# Patient Record
Sex: Female | Born: 1985 | Race: Black or African American | Hispanic: No | Smoking: Current every day smoker
Health system: Southern US, Community
[De-identification: ages and names within clinical notes are randomized; demographics above are authoritative.]

## PROBLEM LIST (undated history)

## (undated) DIAGNOSIS — R569 Unspecified convulsions: Secondary | ICD-10-CM

## (undated) DIAGNOSIS — Z803 Family history of malignant neoplasm of breast: Secondary | ICD-10-CM

## (undated) DIAGNOSIS — K219 Gastro-esophageal reflux disease without esophagitis: Secondary | ICD-10-CM

## (undated) DIAGNOSIS — F32A Depression, unspecified: Secondary | ICD-10-CM

## (undated) DIAGNOSIS — F329 Major depressive disorder, single episode, unspecified: Secondary | ICD-10-CM

## (undated) DIAGNOSIS — J45909 Unspecified asthma, uncomplicated: Secondary | ICD-10-CM

## (undated) DIAGNOSIS — D649 Anemia, unspecified: Secondary | ICD-10-CM

## (undated) HISTORY — PX: TONSILLECTOMY: SUR1361

## (undated) HISTORY — DX: Family history of malignant neoplasm of breast: Z80.3

## (undated) HISTORY — DX: Unspecified asthma, uncomplicated: J45.909

---
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
## 2006-10-22 ENCOUNTER — Emergency Department: Payer: Self-pay | Admitting: Internal Medicine

## 2007-10-18 ENCOUNTER — Ambulatory Visit: Payer: Self-pay | Admitting: Family Medicine

## 2008-02-06 ENCOUNTER — Observation Stay: Payer: Self-pay | Admitting: Unknown Physician Specialty

## 2008-02-08 ENCOUNTER — Observation Stay: Payer: Self-pay

## 2008-02-19 ENCOUNTER — Observation Stay: Payer: Self-pay

## 2008-02-27 ENCOUNTER — Ambulatory Visit: Payer: Self-pay | Admitting: Family Medicine

## 2008-03-06 ENCOUNTER — Observation Stay: Payer: Self-pay

## 2008-03-09 ENCOUNTER — Observation Stay: Payer: Self-pay

## 2008-03-13 ENCOUNTER — Observation Stay: Payer: Self-pay | Admitting: Obstetrics & Gynecology

## 2008-03-19 ENCOUNTER — Observation Stay: Payer: Self-pay | Admitting: Obstetrics & Gynecology

## 2008-03-19 ENCOUNTER — Inpatient Hospital Stay: Payer: Self-pay | Admitting: Obstetrics and Gynecology

## 2008-03-25 ENCOUNTER — Emergency Department: Payer: Self-pay | Admitting: Emergency Medicine

## 2008-03-25 ENCOUNTER — Other Ambulatory Visit: Payer: Self-pay

## 2008-06-17 ENCOUNTER — Emergency Department: Payer: Self-pay | Admitting: Emergency Medicine

## 2008-10-25 IMAGING — CR DG CHEST 2V
1 series · 2 of 2 positions shown · non-contrast
Comparison: none

REASON FOR EXAM: dyspnea
COMMENTS:

PROCEDURE:     DXR - DXR CHEST PA (OR AP) AND LATERAL  - March 25, 2008 [DATE]
RESULT:     The lungs are adequately inflated. There is no focal infiltrate.
The heart is normal in size. The pulmonary vascularity is not engorged.
There is no pleural effusion.

[Series 1: view not recorded · 0.17mm/px · 2 of 2 slices shown]
[im 1/2]
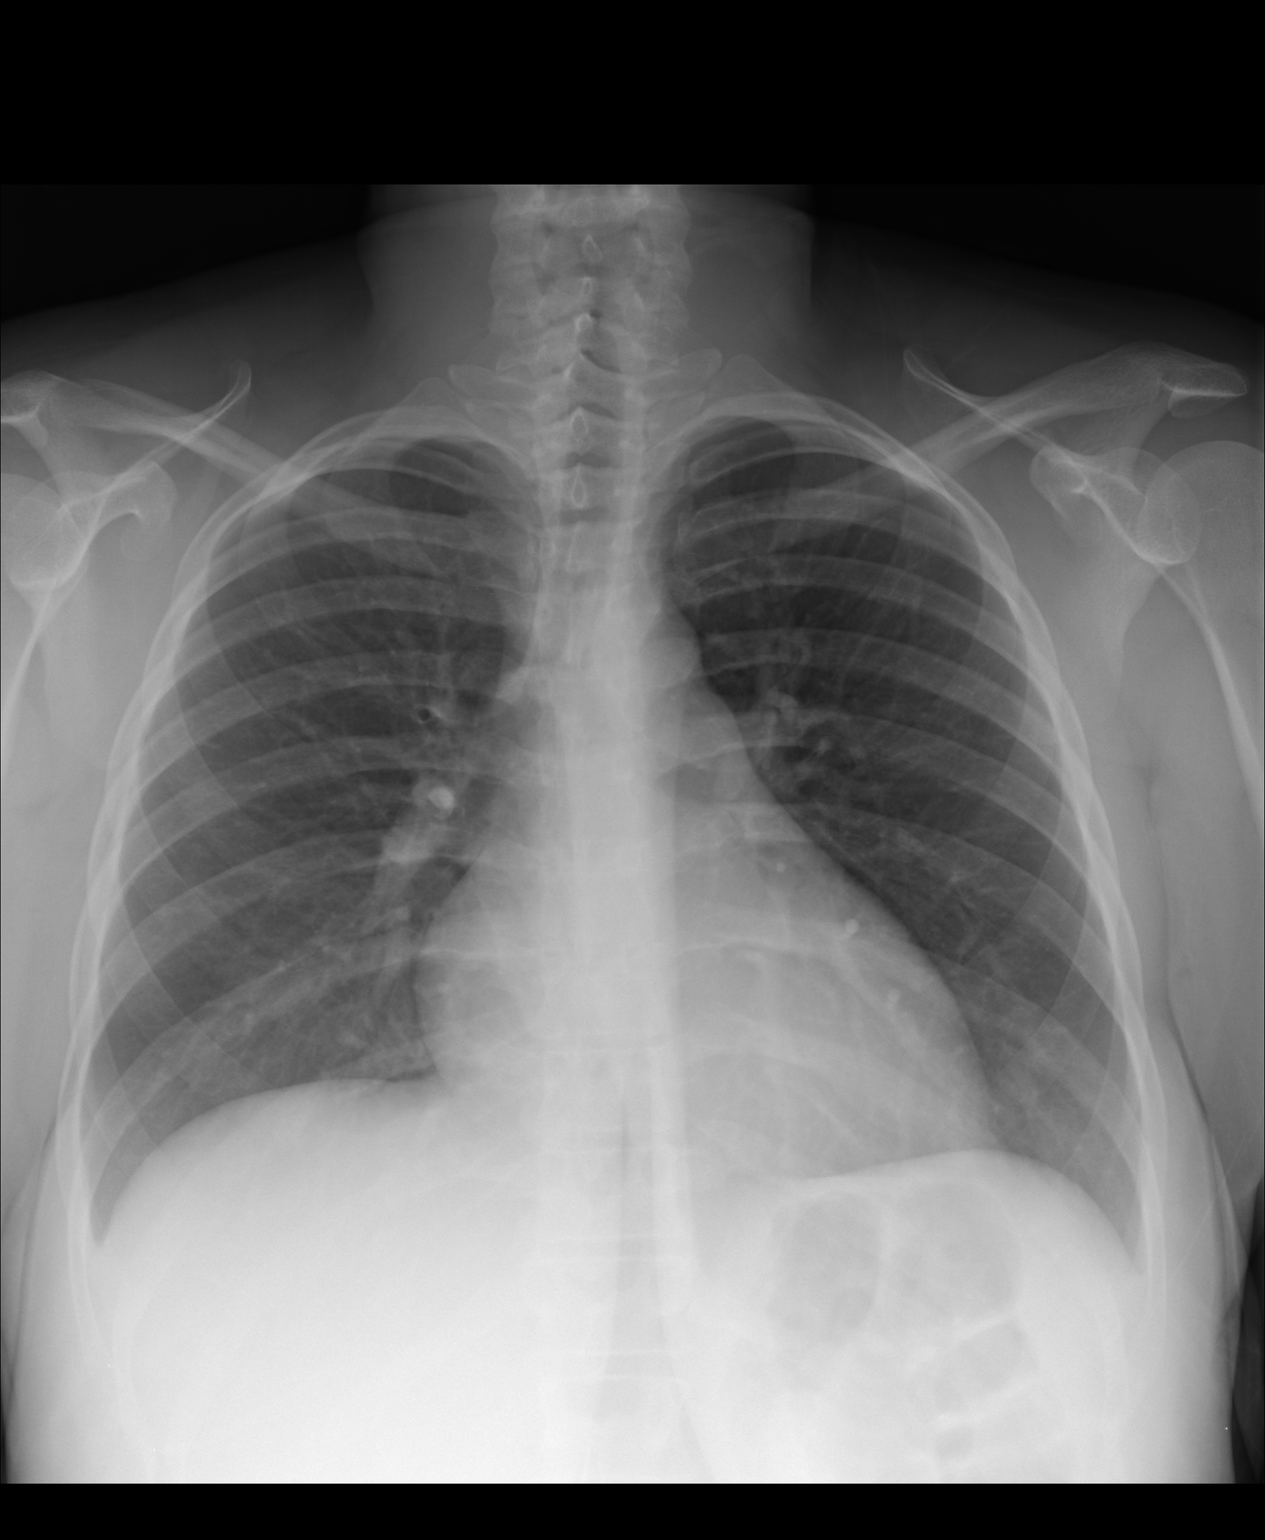
[im 2/2]
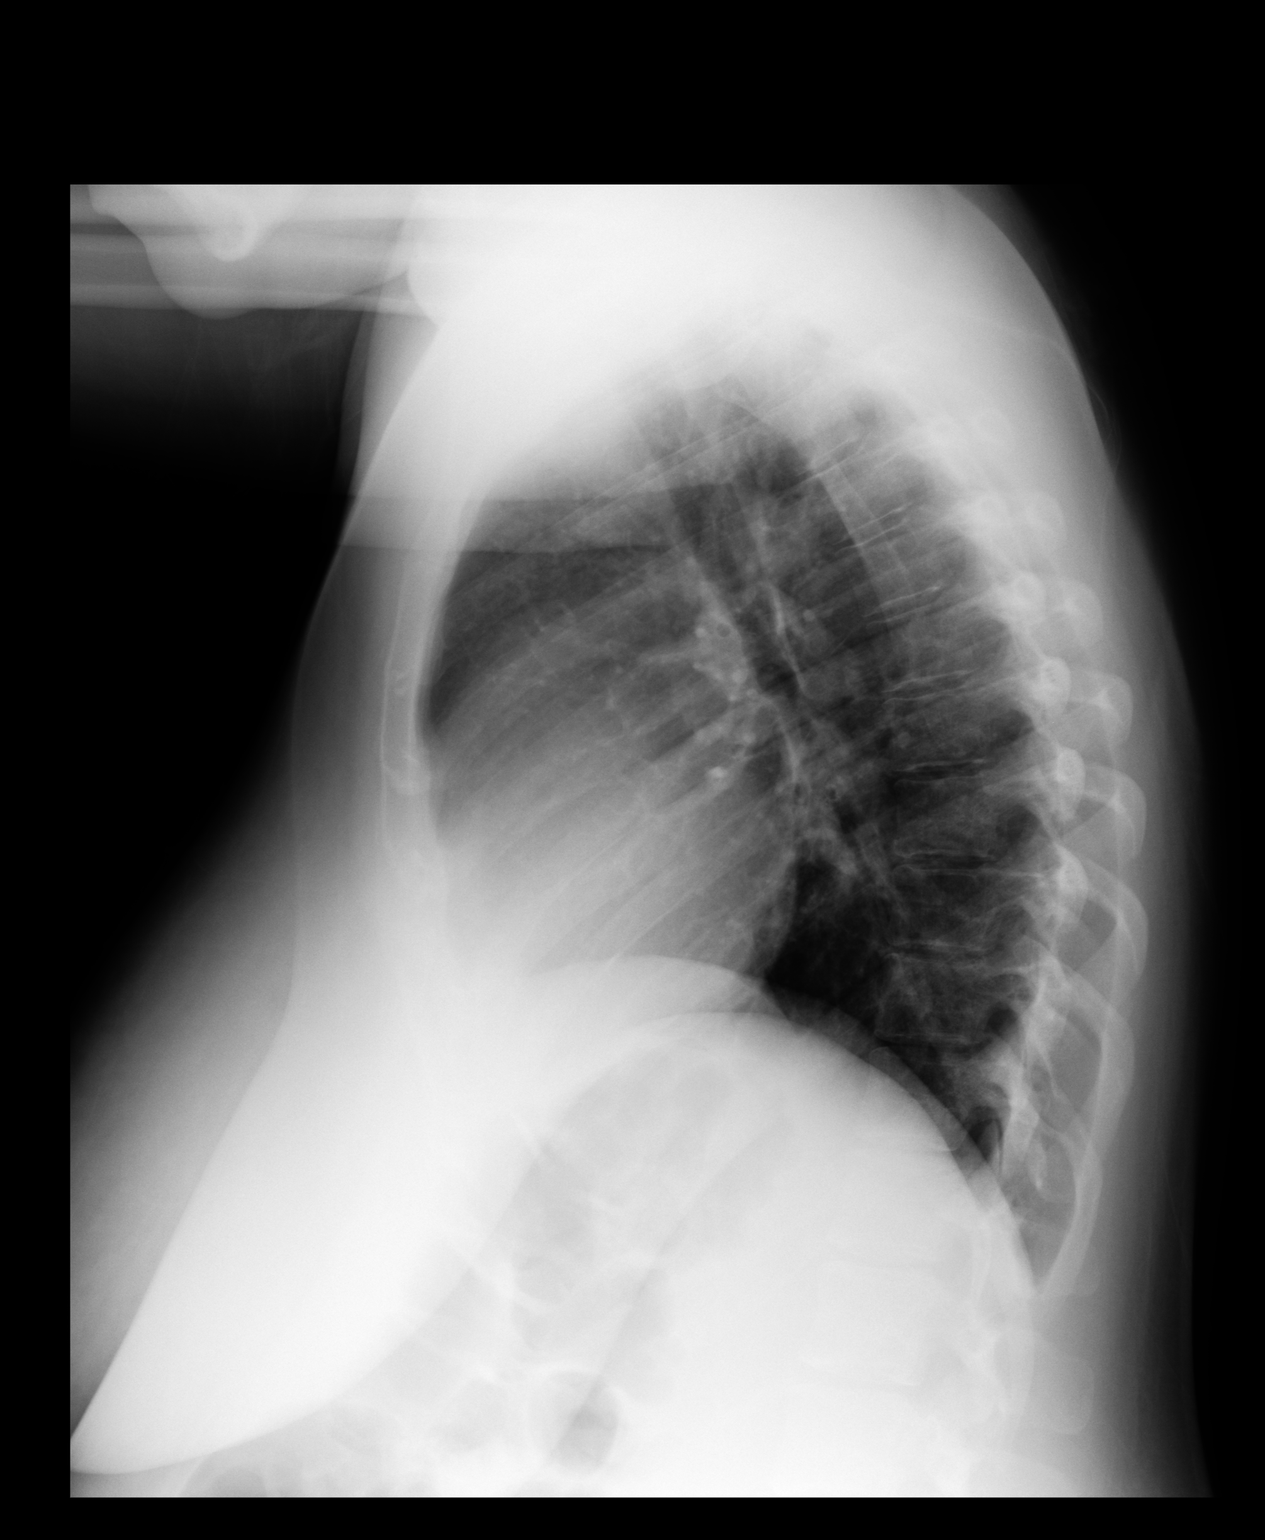

[2 of 2 positions shown; findings below may reference images not displayed]

IMPRESSION: I do not see evidence of pneumonia nor definite evidence of
other acute cardiopulmonary abnormality. I cannot exclude an element of
acute bronchitis in the appropriate clinical setting. Followup films
following therapy would be of value to assure stability or clearing.

## 2009-01-15 ENCOUNTER — Emergency Department: Payer: Self-pay | Admitting: Emergency Medicine

## 2009-08-27 ENCOUNTER — Emergency Department: Payer: Self-pay | Admitting: Emergency Medicine

## 2009-12-10 ENCOUNTER — Emergency Department: Payer: Self-pay | Admitting: Emergency Medicine

## 2009-12-31 ENCOUNTER — Emergency Department: Payer: Self-pay | Admitting: Emergency Medicine

## 2010-03-03 ENCOUNTER — Ambulatory Visit: Payer: Self-pay | Admitting: Family Medicine

## 2010-06-15 ENCOUNTER — Encounter: Payer: Self-pay | Admitting: Maternal and Fetal Medicine

## 2010-08-04 ENCOUNTER — Observation Stay: Payer: Self-pay

## 2010-08-24 ENCOUNTER — Encounter: Payer: Self-pay | Admitting: Maternal & Fetal Medicine

## 2010-08-30 ENCOUNTER — Observation Stay: Payer: Self-pay

## 2010-09-16 ENCOUNTER — Observation Stay: Payer: Self-pay

## 2010-09-21 ENCOUNTER — Observation Stay: Payer: Self-pay | Admitting: Obstetrics & Gynecology

## 2010-09-22 ENCOUNTER — Inpatient Hospital Stay: Payer: Self-pay

## 2010-09-29 ENCOUNTER — Emergency Department: Payer: Self-pay | Admitting: Emergency Medicine

## 2011-02-08 ENCOUNTER — Emergency Department: Payer: Self-pay | Admitting: Emergency Medicine

## 2011-04-19 ENCOUNTER — Emergency Department: Payer: Self-pay

## 2011-11-02 ENCOUNTER — Emergency Department: Payer: Self-pay | Admitting: Emergency Medicine

## 2012-04-23 ENCOUNTER — Emergency Department: Payer: Self-pay | Admitting: Emergency Medicine

## 2012-04-23 LAB — BASIC METABOLIC PANEL
Anion Gap: 9 (ref 7–16)
BUN: 12 mg/dL (ref 7–18)
Calcium, Total: 9.8 mg/dL (ref 8.5–10.1)
Chloride: 96 mmol/L — ABNORMAL LOW (ref 98–107)
Co2: 30 mmol/L (ref 21–32)
EGFR (Non-African Amer.): >60
Glucose: 76 mg/dL (ref 65–99)
Osmolality: 269 (ref 275–301)
Potassium: 3.1 mmol/L — ABNORMAL LOW (ref 3.5–5.1)
Sodium: 135 mmol/L — ABNORMAL LOW (ref 136–145)

## 2012-04-23 LAB — CBC
HCT: 46 % (ref 35.0–47.0)
MCH: 26.5 pg (ref 26.0–34.0)
MCV: 83 fL (ref 80–100)
Platelet: 335 10*3/uL (ref 150–440)
RDW: 14.4 % (ref 11.5–14.5)
WBC: 11.5 10*3/uL — ABNORMAL HIGH (ref 3.6–11.0)

## 2012-06-04 ENCOUNTER — Emergency Department: Payer: Self-pay | Admitting: Emergency Medicine

## 2012-06-04 LAB — COMPREHENSIVE METABOLIC PANEL
Alkaline Phosphatase: 67 U/L (ref 50–136)
Calcium, Total: 9.1 mg/dL (ref 8.5–10.1)
Chloride: 107 mmol/L (ref 98–107)
Co2: 22 mmol/L (ref 21–32)
Creatinine: 0.48 mg/dL — ABNORMAL LOW (ref 0.60–1.30)
EGFR (African American): >60
EGFR (Non-African Amer.): >60
Osmolality: 277 (ref 275–301)
Potassium: 3.4 mmol/L — ABNORMAL LOW (ref 3.5–5.1)
SGPT (ALT): 29 U/L (ref 12–78)
Sodium: 140 mmol/L (ref 136–145)

## 2012-06-04 LAB — URINALYSIS, COMPLETE
Bilirubin,UR: NEGATIVE
Blood: NEGATIVE
Glucose,UR: NEGATIVE mg/dL (ref 0–75)
Leukocyte Esterase: NEGATIVE
RBC,UR: 1 /HPF (ref 0–5)
Specific Gravity: 1.031 (ref 1.003–1.030)
Squamous Epithelial: 6

## 2012-06-04 LAB — CBC
HCT: 43.3 % (ref 35.0–47.0)
HGB: 14.6 g/dL (ref 12.0–16.0)
MCH: 27.8 pg (ref 26.0–34.0)
MCV: 82 fL (ref 80–100)
RBC: 5.27 10*6/uL — ABNORMAL HIGH (ref 3.80–5.20)
RDW: 15.4 % — ABNORMAL HIGH (ref 11.5–14.5)
WBC: 9.1 10*3/uL (ref 3.6–11.0)

## 2012-06-04 LAB — HCG, QUANTITATIVE, PREGNANCY: Beta Hcg, Quant.: 57017 m[IU]/mL — ABNORMAL HIGH

## 2012-08-24 ENCOUNTER — Emergency Department: Payer: Self-pay | Admitting: Emergency Medicine

## 2012-08-25 ENCOUNTER — Emergency Department: Payer: Self-pay | Admitting: Emergency Medicine

## 2012-12-11 ENCOUNTER — Observation Stay: Payer: Self-pay | Admitting: Obstetrics & Gynecology

## 2012-12-18 ENCOUNTER — Observation Stay: Payer: Self-pay

## 2013-01-08 ENCOUNTER — Inpatient Hospital Stay: Payer: Self-pay

## 2013-01-08 LAB — CBC WITH DIFFERENTIAL/PLATELET
Basophil #: 0.1 10*3/uL (ref 0.0–0.1)
Basophil %: 0.7 %
Eosinophil #: 0.2 10*3/uL (ref 0.0–0.7)
HGB: 11.5 g/dL — ABNORMAL LOW (ref 12.0–16.0)
Lymphocyte %: 20.7 %
MCH: 26.2 pg (ref 26.0–34.0)
Monocyte %: 6.4 %
Neutrophil #: 11.2 10*3/uL — ABNORMAL HIGH (ref 1.4–6.5)
Platelet: 247 10*3/uL (ref 150–440)
WBC: 15.8 10*3/uL — ABNORMAL HIGH (ref 3.6–11.0)

## 2013-01-08 LAB — DRUG SCREEN, URINE
Amphetamines, Ur Screen: NEGATIVE
Barbiturates, Ur Screen: NEGATIVE
Benzodiazepine, Ur Scrn: NEGATIVE
Cannabinoid 50 Ng, Ur ~~LOC~~: POSITIVE
Cocaine Metabolite,Ur ~~LOC~~: NEGATIVE
MDMA (Ecstasy)Ur Screen: NEGATIVE
Methadone, Ur Screen: NEGATIVE
Opiate, Ur Screen: POSITIVE
Phencyclidine (PCP) Ur S: NEGATIVE
Tricyclic, Ur Screen: NEGATIVE

## 2013-01-14 LAB — WOUND AEROBIC CULTURE

## 2013-08-27 ENCOUNTER — Emergency Department: Payer: Self-pay | Admitting: Emergency Medicine

## 2013-08-29 ENCOUNTER — Emergency Department: Payer: Self-pay | Admitting: Internal Medicine

## 2013-08-29 LAB — CBC WITH DIFFERENTIAL/PLATELET
Basophil #: 0 10*3/uL (ref 0.0–0.1)
Basophil %: 0.4 %
Eosinophil #: 0.1 10*3/uL (ref 0.0–0.7)
Lymphocyte #: 2 10*3/uL (ref 1.0–3.6)
MCH: 26 pg (ref 26.0–34.0)
MCV: 79 fL — ABNORMAL LOW (ref 80–100)
Monocyte #: 1.1 x10 3/mm — ABNORMAL HIGH (ref 0.2–0.9)
RBC: 4.99 10*6/uL (ref 3.80–5.20)
RDW: 15.8 % — ABNORMAL HIGH (ref 11.5–14.5)

## 2013-08-29 LAB — BASIC METABOLIC PANEL
Anion Gap: 5 — ABNORMAL LOW (ref 7–16)
BUN: 6 mg/dL — ABNORMAL LOW (ref 7–18)
Chloride: 105 mmol/L (ref 98–107)
EGFR (Non-African Amer.): >60
Glucose: 84 mg/dL (ref 65–99)
Potassium: 3.3 mmol/L — ABNORMAL LOW (ref 3.5–5.1)

## 2013-08-31 ENCOUNTER — Emergency Department: Payer: Self-pay | Admitting: Emergency Medicine

## 2013-08-31 LAB — CBC
MCH: 26.1 pg (ref 26.0–34.0)
MCHC: 33.2 g/dL (ref 32.0–36.0)
MCV: 79 fL — ABNORMAL LOW (ref 80–100)
RBC: 5.07 10*6/uL (ref 3.80–5.20)
RDW: 16.2 % — ABNORMAL HIGH (ref 11.5–14.5)
WBC: 10.7 10*3/uL (ref 3.6–11.0)

## 2013-08-31 LAB — BASIC METABOLIC PANEL
Glucose: 90 mg/dL (ref 65–99)
Osmolality: 278 (ref 275–301)
Sodium: 140 mmol/L (ref 136–145)

## 2014-04-05 ENCOUNTER — Encounter: Payer: Self-pay | Admitting: *Deleted

## 2014-04-23 ENCOUNTER — Ambulatory Visit: Payer: Self-pay | Admitting: General Surgery

## 2014-05-09 ENCOUNTER — Ambulatory Visit (INDEPENDENT_AMBULATORY_CARE_PROVIDER_SITE_OTHER): Payer: Medicaid Other | Admitting: General Surgery

## 2014-05-09 ENCOUNTER — Ambulatory Visit: Payer: Medicaid Other

## 2014-05-09 ENCOUNTER — Encounter: Payer: Self-pay | Admitting: General Surgery

## 2014-05-09 VITALS — BP 100/70 | HR 84 | Resp 14 | Ht 64.0 in | Wt 197.0 lb

## 2014-05-09 DIAGNOSIS — R1900 Intra-abdominal and pelvic swelling, mass and lump, unspecified site: Secondary | ICD-10-CM

## 2014-05-09 NOTE — Progress Notes (Signed)
Patient ID: Dorothy Chen, female   DOB: 1986/10/06, 28 y.o.   MRN: [id number]  Chief Complaint  Patient presents with  . Mass    HPI [name] is a 28 y.o. female.  Here today for evaluation of a soft tissue mass left lower abdomen. States it has been there for about 2 years. She states that it has gotten larger over the past 91months. The pain is constant and radiates to her back. It hurts worse when she lifts her children or bends over. Scan was completed April 03 2014.  Pain is markedly worse   during menstrual period.  HPI  Past Medical History  Diagnosis Date  . Asthma     Past Surgical History  Procedure Laterality Date  . Cesarean section  2009,2011,2014    History reviewed. No pertinent family history.  Social History History  Substance Use Topics  . Smoking status: Current Every Day Smoker -- 1.00 packs/day for 12 years  . Smokeless tobacco: Not on file  . Alcohol Use: Yes    Allergies  Allergen Reactions  . Penicillins Anaphylaxis    No current outpatient prescriptions on file.   No current facility-administered medications for this visit.    Review of Systems Review of Systems  Constitutional: Negative.   Respiratory: Negative.   Cardiovascular: Negative.   Gastrointestinal: Positive for constipation. Negative for nausea, vomiting and diarrhea.    Blood pressure 100/70, pulse 84, resp. rate 14, height 5\' 4"  (1.626 m), weight 197 lb (89.359 kg), last menstrual period 04/15/2014.  Physical Exam Physical Exam  Constitutional: She is oriented to person, place, and time. She appears well-developed and well-nourished.  Neck: Neck supple.  Cardiovascular: Normal rate, regular rhythm and normal heart sounds.   Pulmonary/Chest: Effort normal and breath sounds normal.  Abdominal: Soft. Bowel sounds are normal. There is tenderness in the left lower quadrant.  3 cm x 2 cm firm subcutaneous mass located in LLQ just above previous C section scar from 1  year ago.  Neurological: She is alert and oriented to person, place, and time.  Skin: Skin is warm and dry.     Data Reviewed CT report  Assessment    US done in office shows solid subcutaneous mass 3.05 x 2.0 x 1.63cm.  Suspected endometrioma     Plan    Recommended and discussed excision in SDS.  Explained procedure, risks, and benefits.  Patient agreeable.   Patient's surgery has been scheduled for 05-21-14 at [facility].      PCP: Perrin Maltese   Nayef College G 05/09/2014, 11:41 AM

## 2014-05-09 NOTE — Patient Instructions (Addendum)
The patient is aware to call back for any questions or concerns.  Patient's surgery has been scheduled for 05-21-14 at [facility].

## 2014-05-20 ENCOUNTER — Telehealth: Payer: Self-pay | Admitting: *Deleted

## 2014-05-20 NOTE — Telephone Encounter (Signed)
Patient was contacted today to verify that [facility] in [place] had been in contact with the patient regarding need for cardiac clearance. Patient states that she is aware of this and aware that her surgery has been cancelled for tomorrow. The patient is very unhappy with this and states she is not having any cardiac workup completed. While trying to explain why she needed to have this done she hung up.

## 2014-05-20 NOTE — Telephone Encounter (Signed)
Per Nira Conn in [place], patient's surgery has been cancelled for tomorrow. She needs to have a stress test and ECHO prior to excision of abdominal wall mass per Dr. Ronelle Nigh (due to complaints of chest pain 3-4 months ago). Patient will be having this done through Dr. Laurelyn Sickle office. Once we receive clearance, surgery can be rescheduled.  O.R. notified of cancellation.

## 2014-06-11 ENCOUNTER — Encounter: Payer: Self-pay | Admitting: General Surgery

## 2014-06-11 ENCOUNTER — Ambulatory Visit (INDEPENDENT_AMBULATORY_CARE_PROVIDER_SITE_OTHER): Payer: Medicaid Other | Admitting: General Surgery

## 2014-06-11 VITALS — BP 126/74 | HR 76 | Resp 12 | Ht 64.0 in | Wt 196.0 lb

## 2014-06-11 DIAGNOSIS — R1900 Intra-abdominal and pelvic swelling, mass and lump, unspecified site: Secondary | ICD-10-CM

## 2014-06-11 NOTE — Progress Notes (Signed)
Patient ID: Dorothy Chen, female   DOB: 17-Jan-1986, 28 y.o.   MRN: [id number]  Chief Complaint  Patient presents with  . Pre-op Exam    excision abd wall mass    HPI Dorothy Chen is a 28 y.o. female here today for her pre-op soft tissue mass left lower abdomen excision. She required Cardiac clearance which since then has been obtained. Patient reports some increase with pain in the mass in left lower quadrant. She is currently having her period.   HPI  Past Medical History  Diagnosis Date  . Asthma     Past Surgical History  Procedure Laterality Date  . Cesarean section  2009,2011,2014    History reviewed. No pertinent family history.  Social History History  Substance Use Topics  . Smoking status: Current Every Day Smoker -- 1.00 packs/day for 12 years  . Smokeless tobacco: Not on file  . Alcohol Use: Yes    Allergies  Allergen Reactions  . Penicillins Anaphylaxis    No current outpatient prescriptions on file.   No current facility-administered medications for this visit.    Review of Systems Review of Systems  Constitutional: Negative.   Respiratory: Negative.   Cardiovascular: Negative.     Blood pressure 126/74, pulse 76, resp. rate 12, height 5\' 4"  (1.626 m), weight 196 lb (88.905 kg).  Physical Exam Physical Exam  Constitutional: She is oriented to person, place, and time. She appears well-developed and well-nourished.  Cardiovascular: Normal rate, regular rhythm and normal heart sounds.   No murmur heard. Pulmonary/Chest: Effort normal and breath sounds normal.  Abdominal: Soft. Normal appearance and bowel sounds are normal. There is no hepatosplenomegaly. There is tenderness in the left lower quadrant. No hernia.  Neurological: She is alert and oriented to person, place, and time.  Skin: Skin is warm and dry.    Data Reviewed Cardiology note reviewed from Dr. Neoma Laming.  Assessment    Left lower quadrant soft tissue mass. 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     Plan    Excision to be scheduled for next week. Previously discussed.      Patient is scheduled for surgery at [facility] on 06/18/14. She has received Cardiac Clearance from Dr Neoma Laming. She will pre admit by phone. Patient is aware of date and instructions.   Kendre Jacinto G 06/11/2014, 12:26 PM

## 2014-06-11 NOTE — Patient Instructions (Addendum)
The patient is aware to call back for any questions or concerns.  Patient is scheduled for surgery at [facility] on 06/18/14. She has received Cardiac Clearance from Dr Neoma Laming. She will pre admit by phone. Patient is aware of date and instructions.

## 2014-06-18 ENCOUNTER — Ambulatory Visit: Payer: Self-pay | Admitting: General Surgery

## 2014-06-18 DIAGNOSIS — D214 Benign neoplasm of connective and other soft tissue of abdomen: Secondary | ICD-10-CM

## 2014-06-19 ENCOUNTER — Encounter: Payer: Self-pay | Admitting: General Surgery

## 2014-06-19 LAB — PATHOLOGY REPORT

## 2014-06-20 ENCOUNTER — Encounter: Payer: Self-pay | Admitting: General Surgery

## 2014-06-26 ENCOUNTER — Emergency Department: Payer: Self-pay | Admitting: Emergency Medicine

## 2014-06-28 LAB — BETA STREP CULTURE(ARMC)

## 2014-07-01 ENCOUNTER — Ambulatory Visit: Payer: Self-pay | Admitting: General Surgery

## 2014-07-04 ENCOUNTER — Emergency Department: Payer: Self-pay | Admitting: Internal Medicine

## 2014-07-04 LAB — BASIC METABOLIC PANEL
ANION GAP: 7 (ref 7–16)
BUN: 9 mg/dL (ref 7–18)
CALCIUM: 8.3 mg/dL — AB (ref 8.5–10.1)
CO2: 27 mmol/L (ref 21–32)
Chloride: 108 mmol/L — ABNORMAL HIGH (ref 98–107)
Creatinine: 0.7 mg/dL (ref 0.60–1.30)
EGFR (African American): >60
EGFR (Non-African Amer.): >60
Glucose: 115 mg/dL — ABNORMAL HIGH (ref 65–99)
Osmolality: 283 (ref 275–301)
Potassium: 3.2 mmol/L — ABNORMAL LOW (ref 3.5–5.1)
Sodium: 142 mmol/L (ref 136–145)

## 2014-07-04 LAB — CBC WITH DIFFERENTIAL/PLATELET
BASOS ABS: 0.1 10*3/uL (ref 0.0–0.1)
Basophil %: 0.7 %
EOS ABS: 0.2 10*3/uL (ref 0.0–0.7)
Eosinophil %: 1.6 %
HCT: 35.3 % (ref 35.0–47.0)
HGB: 10.9 g/dL — ABNORMAL LOW (ref 12.0–16.0)
LYMPHS ABS: 2.2 10*3/uL (ref 1.0–3.6)
Lymphocyte %: 20.1 %
MCH: 23.5 pg — ABNORMAL LOW (ref 26.0–34.0)
MCHC: 30.9 g/dL — ABNORMAL LOW (ref 32.0–36.0)
MCV: 76 fL — AB (ref 80–100)
MONO ABS: 0.5 x10 3/mm (ref 0.2–0.9)
MONOS PCT: 4.6 %
NEUTROS ABS: 8 10*3/uL — AB (ref 1.4–6.5)
NEUTROS PCT: 73 %
Platelet: 264 10*3/uL (ref 150–440)
RBC: 4.65 10*6/uL (ref 3.80–5.20)
RDW: 17.2 % — AB (ref 11.5–14.5)
WBC: 11 10*3/uL (ref 3.6–11.0)

## 2014-07-04 LAB — MONONUCLEOSIS SCREEN: MONO TEST: NEGATIVE

## 2014-07-18 ENCOUNTER — Encounter: Payer: Self-pay | Admitting: *Deleted

## 2014-07-19 ENCOUNTER — Emergency Department: Payer: Self-pay | Admitting: Emergency Medicine

## 2014-07-19 LAB — COMPREHENSIVE METABOLIC PANEL
ALT: 15 U/L
Albumin: 3.5 g/dL (ref 3.4–5.0)
Alkaline Phosphatase: 58 U/L
Anion Gap: 6 — ABNORMAL LOW (ref 7–16)
BUN: 11 mg/dL (ref 7–18)
Bilirubin,Total: 0.3 mg/dL (ref 0.2–1.0)
CHLORIDE: 106 mmol/L (ref 98–107)
CO2: 28 mmol/L (ref 21–32)
CREATININE: 0.61 mg/dL (ref 0.60–1.30)
Calcium, Total: 9 mg/dL (ref 8.5–10.1)
EGFR (African American): >60
EGFR (Non-African Amer.): >60
GLUCOSE: 88 mg/dL (ref 65–99)
OSMOLALITY: 278 (ref 275–301)
Potassium: 3.4 mmol/L — ABNORMAL LOW (ref 3.5–5.1)
SGOT(AST): 11 U/L — ABNORMAL LOW (ref 15–37)
Sodium: 140 mmol/L (ref 136–145)
Total Protein: 7.5 g/dL (ref 6.4–8.2)

## 2014-07-19 LAB — CBC
HCT: 35.4 % (ref 35.0–47.0)
HGB: 10.9 g/dL — AB (ref 12.0–16.0)
MCH: 23.1 pg — ABNORMAL LOW (ref 26.0–34.0)
MCHC: 30.7 g/dL — AB (ref 32.0–36.0)
MCV: 75 fL — ABNORMAL LOW (ref 80–100)
PLATELETS: 261 10*3/uL (ref 150–440)
RBC: 4.71 10*6/uL (ref 3.80–5.20)
RDW: 17.3 % — AB (ref 11.5–14.5)
WBC: 6.5 10*3/uL (ref 3.6–11.0)

## 2014-09-02 ENCOUNTER — Encounter: Payer: Self-pay | Admitting: General Surgery

## 2014-11-14 LAB — BASIC METABOLIC PANEL
Anion Gap: 8 (ref 7–16)
BUN: 7 mg/dL (ref 7–18)
CALCIUM: 8.3 mg/dL — AB (ref 8.5–10.1)
CREATININE: 0.69 mg/dL (ref 0.60–1.30)
Chloride: 106 mmol/L (ref 98–107)
Co2: 24 mmol/L (ref 21–32)
EGFR (African American): >60
EGFR (Non-African Amer.): >60
Glucose: 87 mg/dL (ref 65–99)
OSMOLALITY: 273 (ref 275–301)
Potassium: 3.4 mmol/L — ABNORMAL LOW (ref 3.5–5.1)
Sodium: 138 mmol/L (ref 136–145)

## 2014-11-14 LAB — CBC WITH DIFFERENTIAL/PLATELET
Basophil #: 0.1 10*3/uL (ref 0.0–0.1)
Basophil %: 0.6 %
EOS ABS: 0.1 10*3/uL (ref 0.0–0.7)
EOS PCT: 0.8 %
HCT: 40 % (ref 35.0–47.0)
HGB: 12.6 g/dL (ref 12.0–16.0)
LYMPHS PCT: 18.9 %
Lymphocyte #: 1.8 10*3/uL (ref 1.0–3.6)
MCH: 23.1 pg — ABNORMAL LOW (ref 26.0–34.0)
MCHC: 31.6 g/dL — AB (ref 32.0–36.0)
MCV: 73 fL — ABNORMAL LOW (ref 80–100)
Monocyte #: 0.8 x10 3/mm (ref 0.2–0.9)
Monocyte %: 8.5 %
NEUTROS PCT: 71.2 %
Neutrophil #: 6.8 10*3/uL — ABNORMAL HIGH (ref 1.4–6.5)
Platelet: 245 10*3/uL (ref 150–440)
RBC: 5.47 10*6/uL — ABNORMAL HIGH (ref 3.80–5.20)
RDW: 19.5 % — ABNORMAL HIGH (ref 11.5–14.5)
WBC: 9.6 10*3/uL (ref 3.6–11.0)

## 2014-11-14 LAB — URINALYSIS, COMPLETE
Bacteria: NONE SEEN
Bilirubin,UR: NEGATIVE
Blood: NEGATIVE
Glucose,UR: NEGATIVE mg/dL (ref 0–75)
Leukocyte Esterase: NEGATIVE
NITRITE: NEGATIVE
Ph: 5 (ref 4.5–8.0)
RBC,UR: NONE SEEN /HPF (ref 0–5)
Specific Gravity: 1.031 (ref 1.003–1.030)
Squamous Epithelial: 12
WBC UR: 1 /HPF (ref 0–5)

## 2014-11-14 LAB — TROPONIN I: Troponin-I: <0.02 ng/mL

## 2014-11-14 LAB — HEPATIC FUNCTION PANEL A (ARMC)
Albumin: 3.4 g/dL (ref 3.4–5.0)
Alkaline Phosphatase: 56 U/L
Bilirubin,Total: 0.3 mg/dL (ref 0.2–1.0)
SGOT(AST): 19 U/L (ref 15–37)
SGPT (ALT): 27 U/L
Total Protein: 6.9 g/dL (ref 6.4–8.2)

## 2014-11-14 LAB — LIPASE, BLOOD: Lipase: 46 U/L — ABNORMAL LOW (ref 73–393)

## 2014-11-15 ENCOUNTER — Encounter (HOSPITAL_BASED_OUTPATIENT_CLINIC_OR_DEPARTMENT_OTHER): Payer: Medicaid Other | Admitting: General Surgery

## 2014-11-15 DIAGNOSIS — K8000 Calculus of gallbladder with acute cholecystitis without obstruction: Secondary | ICD-10-CM | POA: Diagnosis not present

## 2014-11-15 LAB — CBC WITH DIFFERENTIAL/PLATELET
Basophil #: 0 10*3/uL (ref 0.0–0.1)
Basophil %: 0.7 %
EOS PCT: 2.1 %
Eosinophil #: 0.1 10*3/uL (ref 0.0–0.7)
HCT: 32.9 % — ABNORMAL LOW (ref 35.0–47.0)
HGB: 10.3 g/dL — ABNORMAL LOW (ref 12.0–16.0)
Lymphocyte #: 1.3 10*3/uL (ref 1.0–3.6)
Lymphocyte %: 26.4 %
MCH: 23 pg — AB (ref 26.0–34.0)
MCHC: 31.2 g/dL — ABNORMAL LOW (ref 32.0–36.0)
MCV: 74 fL — ABNORMAL LOW (ref 80–100)
MONO ABS: 0.5 x10 3/mm (ref 0.2–0.9)
Monocyte %: 9.9 %
Neutrophil #: 3 10*3/uL (ref 1.4–6.5)
Neutrophil %: 60.9 %
PLATELETS: 196 10*3/uL (ref 150–440)
RBC: 4.47 10*6/uL (ref 3.80–5.20)
RDW: 19.9 % — ABNORMAL HIGH (ref 11.5–14.5)
WBC: 4.9 10*3/uL (ref 3.6–11.0)

## 2014-11-15 LAB — COMPREHENSIVE METABOLIC PANEL
ALK PHOS: 68 U/L
ANION GAP: 6 — AB (ref 7–16)
Albumin: 2.8 g/dL — ABNORMAL LOW (ref 3.4–5.0)
BUN: 8 mg/dL (ref 7–18)
Bilirubin,Total: 0.3 mg/dL (ref 0.2–1.0)
Calcium, Total: 7.3 mg/dL — ABNORMAL LOW (ref 8.5–10.1)
Chloride: 106 mmol/L (ref 98–107)
Co2: 27 mmol/L (ref 21–32)
Creatinine: 0.74 mg/dL (ref 0.60–1.30)
EGFR (African American): >60
EGFR (Non-African Amer.): >60
Glucose: 88 mg/dL (ref 65–99)
Osmolality: 275 (ref 275–301)
POTASSIUM: 3 mmol/L — AB (ref 3.5–5.1)
SGOT(AST): 154 U/L — ABNORMAL HIGH (ref 15–37)
SGPT (ALT): 123 U/L — ABNORMAL HIGH
SODIUM: 139 mmol/L (ref 136–145)
TOTAL PROTEIN: 6 g/dL — AB (ref 6.4–8.2)

## 2014-11-16 LAB — COMPREHENSIVE METABOLIC PANEL
ALBUMIN: 2.3 g/dL — AB (ref 3.4–5.0)
Alkaline Phosphatase: 65 U/L
Anion Gap: 6 — ABNORMAL LOW (ref 7–16)
BUN: 3 mg/dL — ABNORMAL LOW (ref 7–18)
Bilirubin,Total: 0.3 mg/dL (ref 0.2–1.0)
CHLORIDE: 106 mmol/L (ref 98–107)
CO2: 26 mmol/L (ref 21–32)
Calcium, Total: 7.5 mg/dL — ABNORMAL LOW (ref 8.5–10.1)
Creatinine: 0.69 mg/dL (ref 0.60–1.30)
EGFR (Non-African Amer.): >60
Glucose: 118 mg/dL — ABNORMAL HIGH (ref 65–99)
OSMOLALITY: 273 (ref 275–301)
Potassium: 3.4 mmol/L — ABNORMAL LOW (ref 3.5–5.1)
SGOT(AST): 64 U/L — ABNORMAL HIGH (ref 15–37)
SGPT (ALT): 99 U/L — ABNORMAL HIGH
SODIUM: 138 mmol/L (ref 136–145)
TOTAL PROTEIN: 5.6 g/dL — AB (ref 6.4–8.2)

## 2014-11-16 LAB — CBC WITH DIFFERENTIAL/PLATELET
BASOS PCT: 0.2 %
Basophil #: 0 10*3/uL (ref 0.0–0.1)
EOS PCT: 0.2 %
Eosinophil #: 0 10*3/uL (ref 0.0–0.7)
HCT: 30 % — AB (ref 35.0–47.0)
HGB: 9.6 g/dL — ABNORMAL LOW (ref 12.0–16.0)
Lymphocyte #: 0.9 10*3/uL — ABNORMAL LOW (ref 1.0–3.6)
Lymphocyte %: 8.7 %
MCH: 23.2 pg — ABNORMAL LOW (ref 26.0–34.0)
MCHC: 31.8 g/dL — ABNORMAL LOW (ref 32.0–36.0)
MCV: 73 fL — AB (ref 80–100)
MONO ABS: 0.6 x10 3/mm (ref 0.2–0.9)
MONOS PCT: 6.2 %
NEUTROS PCT: 84.7 %
Neutrophil #: 8.6 10*3/uL — ABNORMAL HIGH (ref 1.4–6.5)
PLATELETS: 193 10*3/uL (ref 150–440)
RBC: 4.12 10*6/uL (ref 3.80–5.20)
RDW: 19.7 % — ABNORMAL HIGH (ref 11.5–14.5)
WBC: 10.1 10*3/uL (ref 3.6–11.0)

## 2014-11-16 LAB — URINALYSIS, COMPLETE
RBC,UR: 20872 /HPF (ref 0–5)
Specific Gravity: 1.024 (ref 1.003–1.030)
WBC UR: 189 /HPF (ref 0–5)

## 2014-11-17 ENCOUNTER — Inpatient Hospital Stay: Payer: Self-pay | Admitting: General Surgery

## 2014-11-18 ENCOUNTER — Encounter: Payer: Self-pay | Admitting: General Surgery

## 2014-11-18 LAB — GENTAMICIN LEVEL, TROUGH: Gentamicin, Trough: 0.3 ug/mL (ref 0.0–2.0)

## 2014-11-18 LAB — GENTAMICIN LEVEL, PEAK: Gentamicin, Peak: 6.9 ug/mL (ref 4.0–8.0)

## 2014-11-18 LAB — URINE CULTURE

## 2014-11-19 ENCOUNTER — Encounter: Payer: Self-pay | Admitting: General Surgery

## 2014-11-20 LAB — WOUND CULTURE

## 2014-11-25 ENCOUNTER — Ambulatory Visit: Payer: Medicaid Other | Admitting: General Surgery

## 2014-11-27 ENCOUNTER — Ambulatory Visit: Payer: Medicaid Other | Admitting: General Surgery

## 2014-12-03 ENCOUNTER — Telehealth: Payer: Self-pay | Admitting: *Deleted

## 2014-12-03 NOTE — Telephone Encounter (Signed)
I talked with the patient and she states she is doing well since surgery. She missed her postoperative appointment and she says she has a had time with transportation. She states all areas are healed and she is getting along well. She will contact us for new concerns or issues.

## 2014-12-03 NOTE — Telephone Encounter (Signed)
Ok for pt to follow up as needed.

## 2015-02-21 NOTE — Op Note (Signed)
PATIENT NAME:  Dorothy Chen, Dorothy Chen MR#:  [id number] DATE OF BIRTH:  [birth year]  DATE OF PROCEDURE:  01/08/2013  PREOPERATIVE DIAGNOSIS: Term intrauterine pregnancy, prior history of cesarean section.   POSTOPERATIVE DIAGNOSIS: Term intrauterine pregnancy, prior history of cesarean section.   PROCEDURE PERFORMED: Low transverse cesarean section.   SURGEON: Glean Salen, MD   ASSISTANT: Midwife Danise Mina   ANESTHESIA: Spinal.   ESTIMATED BLOOD LOSS: 500 mL.   COMPLICATIONS: None.   FINDINGS: Significant adhesions of the uterus to the anterior abdominal wall. This led to the inability to externalize the uterus after delivery of the baby, although adequate exposure and closure was performed, unable to visualize the ovaries and tubes. Bladder was normal throughout the procedure.   The patient also had delivery of a viable female infant weighing 7 pounds 4 ounces with Apgar scores of 9 and 9 at 1 and 5 minutes, respectively.   DISPOSITION: To the recovery room in stable condition.   TECHNIQUE: The patient is prepped and draped in the usual sterile fashion after adequate anesthesia is obtained in the supine position on the operating room table. A scalpel is used to create a low transverse skin incision in the area of the prior scar. The incision was carried down to the level of the rectus fascia which is then dissected bilaterally using Mayo scissors. The rectus muscles are separated in the midline, separated from the fascia. The peritoneum is penetrated, and the above-mentioned findings are noted with significant adhesions, particularly on the left side of the uterus to the anterior abdominal wall. A scalpel was used to create a low transverse hysterotomy incision after the bladder is inferiorly dissected and retracted. It is then extended by blunt dissection, and an amniotomy reveals clear fluid.   The infant's head is grasped and delivered without the use of a suction device. The oropharynx is  suctioned. No nuchal cord is noted.  The remaining portion of the infant is delivered.   Cord blood is obtained and the placenta is manually extracted. The uterus is cleansed of all membranes and debris using a moist sponge. It is unable to be externalized due to adhesions. The hysterotomy incision is closed with a running #1 Vicryl suture in a locking fashion with excellent hemostasis noted. Interceed is placed over the incision to minimize further adhesion formation.   The peritoneum is closed with a Vicryl suture. Trocars are placed through the abdomen into the subfascial space, and then Silver Soaker catheters are threaded into place for administration of the On-Q pain pump. The fascia is closed with 0 Maxon sutures. Subcutaneous tissues are irrigated and hemostasis is assured using electrocautery. The skin is closed with surgical clips and a bandage is applied. The catheters are flushed with 5 mL each of bupivacaine and then stabilized in place with Dermabond, Steri-Strips and a Tegaderm bandage. The patient goes to the recovery room in stable condition, tolerating the procedure well. All sponge, instrument and needle counts are correct.    ____________________________ R. Barnett Applebaum, MD rph:cb D: 01/08/2013 02:54:43 ET T: 01/08/2013 10:41:29 ET JOB#: [id number]  cc: Glean Salen, MD, <Dictator> Gae Dry MD ELECTRONICALLY SIGNED 01/13/2013 9:43

## 2015-02-22 NOTE — Consult Note (Signed)
PATIENT NAME:  Dorothy Chen, Dorothy Chen MR#:  [id number] DATE OF BIRTH:  04/03/1986  DATE OF CONSULTATION:  07/19/2014  REFERRING PHYSICIAN:   CONSULTING PHYSICIAN:  Huey Romans, MD  REASON FOR CONSULTATION: Right peritonsillar abscess.   HISTORY OF PRESENT ILLNESS: The patient has had a sore throat now for the last 4 weeks, has come to the ER 2 times previously, had negative strep cultures and was just told it was a viral throat problem that would resolve. This is her third visit and this time she is worse. She is having trouble with swallowing, it hurts a lot, referred to her ear as well. She has problems with swallowing and there is nothing that seems to really help it.   PAST MEDICAL HISTORY: Significant for being fairly healthy. She has had some seizures and asthma, but not had any of that recently. She has had a lot of tonsil problems in the past.   ALLERGIES: PENICILLIN.  REVIEW OF SYSTEMS: The patient has not had any GIA problems. No vomiting. She is not short of breath and has not been coughing.   SOCIAL HISTORY: She denies smoking, using drugs or alcohol.   PHYSICAL EXAMINATION: GENERAL: The patient is awake and alert and oriented. She is not in any acute distress right now.  HEENT: She said she did have some purulence that she spit out about an hour or so ago. It feels like some of the pressure was relieved on the right side. She has swelling of the right tonsillar area and the right palate. It is not hot red or bulging. There is no cellulitis involving the palate tissue. The left side has a large tonsil, but it is not hot red or have any exudate on it.  NECK: It is a little bit tender on the right side, but no sign of significant nodes here.  EARS: Not acutely inflamed.  LUNGS: Clear.  HEART: Regular rate and rhythm without murmur.  EXTREMITIES: Without any edema or deformity.   DIAGNOSTIC DATA: I reviewed the CT scan of the patient. This does show what looks like a peritonsillar  abscess or an intratonsillar abscess. This was done earlier this morning before it seemed like it drained a little bit. No other abnormalities noted.  PROCEDURE: A right peritonsillar aspirate was performed using Cetacaine spray. This is dictated in detail elsewhere. No purulence was found in the right peritonsillar area.   IMPRESSION: The patient has had a right peritonsillar abscess with some spontaneous drainage.  PLAN: She is going to gargle at home. She is going to be on clindamycin for control of the infection since she is penicillin allergic. She was given some Norco 5/325 pain medication to use on a p.r.n. basis until things get better. She is going to follow up in my office in about 2 weeks to make sure that the infection is completely gone and then we will plan to line her up for tonsillectomy since she has had such problems with her tonsils in the past. She will call the office and return sooner if there is any worsening of her pain. ____________________________ Huey Romans, MD phj:sb D: 07/19/2014 13:03:28 ET T: 07/19/2014 13:32:54 ET JOB#: [id number]  cc: Huey Romans, MD, <Dictator> Huey Romans MD ELECTRONICALLY SIGNED 07/21/2014 9:53

## 2015-02-22 NOTE — Op Note (Signed)
PATIENT NAME:  Dorothy Chen, Dorothy Chen MR#:  [id number] DATE OF BIRTH:  Mar 24, 1986  DATE OF PROCEDURE:  07/19/2014  PREOPERATIVE DIAGNOSIS: Right peritonsillar abscess.  POSTOPERATIVE DIAGNOSIS: Right peritonsillar abscess.    OPERATIVE PROCEDURE: Aspiration of right peritonsillar abscess.  SURGEON: Huey Romans, MD  ANESTHESIA: Topical.   DESCRIPTION OF PROCEDURE: The patient was seen in the Emergency Room with a right peritonsillar abscess. She felt like some of it had drained already spontaneously within the last hour or so. The right peritonsillar area was aspirated to see if there was any remaining purulence in a pocket.   The patient's throat was sprayed with Cetacaine spray a couple of times to help give topical anesthesia, and a #18-gauge needle on a 10 mL syringe was used to aspirate from the right peritonsillar area. First it went up onto the soft palate above the tonsil to see if there was a pocket there. I did not get anything out. I then aspirated a second time more inferiorly and laterally, and did not get anything out of at that time either.  The patient tolerated the procedure well.   This was done at the bedside in the Emergency Room.   There were no operative complications.    ____________________________ Huey Romans, MD phj:MT D: 07/19/2014 12:57:28 ET T: 07/19/2014 13:56:31 ET JOB#: [id number]  cc: Huey Romans, MD, <Dictator> Huey Romans MD ELECTRONICALLY SIGNED 07/21/2014 9:53

## 2015-02-22 NOTE — Op Note (Signed)
PATIENT NAME:  Dorothy Chen, Dorothy Chen MR#:  [id number] DATE OF BIRTH:  27-Jul-1986  DATE OF PROCEDURE:  06/18/2014  PREOPERATIVE DIAGNOSIS: Abdominal wall mass, left lower quadrant.   POSTOPERATIVE DIAGNOSIS: Abdominal wall mass, left lower quadrant, suspect endometrioma.   PROCEDURE: Excision of abdominal wall mass.   SURGEON: Mckinley Jewel, M.D.   ANESTHESIA: General.   COMPLICATIONS: None.   ESTIMATED BLOOD LOSS: Minimal.   DRAINS: None.   DESCRIPTION OF PROCEDURE: The patient was placed in the supine position on the operating room table and put to sleep with a LMA. The mass in question was located in the left lower quadrant just above the lateral end of a previous C-section incision. This area was prepped and draped out as a sterile field and timeout was performed. Ultrasound was then used and the ultrasound probe was placed over the vaguely palpable mass and there was an ill-defined mixed echogenic mass located at this site, approximately 3 to 4 cm in size. It was irregular in shape. This area was marked on the skin. A transverse skin incision was then made overlying this and deepened through the subcutaneous tissue and the deep layers, and a hard mass was identified, and using cautery and ligatures of 3-0 Vicryl further dissection was completed until the mass could be freed all around down to the posterior aspect, which was firmly adherent to the fascia. A portion of the fascia was then excised out along with this mass. It measured 4 cm in maximal size. It was sent in formalin for pathology. The fascial opening was then closed with interrupted figure-of-eight stitches of 2-0 PDS. The wound was irrigated. The subcutaneous tissue was closed with 2-0 Vicryl, and the skin closed with subcuticular 4-0 Vicryl covered with Dermabond. The procedure was well tolerated with no immediate problems encountered. She was extubated and returned to the recovery room in stable condition.     ____________________________ S.Robinette Haines, MD sgs:sb D: 06/18/2014 08:48:21 ET T: 06/18/2014 11:22:16 ET JOB#: [id number]  cc: Synthia Innocent. Jamal Collin, MD, <Dictator> [facility] Robinette Haines MD ELECTRONICALLY SIGNED 06/25/2014 12:46

## 2015-02-24 LAB — SURGICAL PATHOLOGY

## 2015-03-02 NOTE — H&P (Signed)
   Subjective/Chief Complaint RUQ/epigastric pain, N/V/diarrhea (resolved)   History of Present Illness 29 yo F with no significant PMH who presents with 4 days of RUQ pain, 1 day each of N/V/D, now resolved.  Began acutely.  Has never had before.  Worse with movement and deep breaths.  Has never had before.  T current 101.5, U/S shows small stones and thickened gb wall to 9 mm.  No sick contacts, no unusual ingestions.  Pain is constant but comes in waves.  Goes to back.  R>L   Past History H/o c sxn H/o asthma   Code Status Full Code   Past Med/Surgical Hx:  Seizures: last age 74  asthma:   miscarriage:   Cesarean Section:   ALLERGIES:  Penicillin: Hives  Family and Social History:  Family History Coronary Artery Disease  Hypertension  Diabetes Mellitus   Social History positive  tobacco, negative ETOH, 3 cigarettes day   Place of Living Home   Review of Systems:  Subjective/Chief Complaint RUQ/epigastric pain, resolving N/V/D.   Fever/Chills Yes   Cough No   Sputum No   Abdominal Pain Yes   Diarrhea Yes  Resolved   Nausea/Vomiting Yes  Resolved   SOB/DOE No   Chest Pain No   Tolerating Diet Yes  Nauseated  Vomiting   Physical Exam:  GEN well developed, well nourished, no acute distress, obese   HEENT pink conjunctivae, PERRL, hearing intact to voice, good dentition   RESP normal resp effort  clear BS  no use of accessory muscles   CARD regular rate  no murmur   ABD positive tenderness  no hernia  soft  distended  RUQ>LUQ   EXTR negative cyanosis/clubbing, negative edema   SKIN normal to palpation, No rashes, No ulcers, skin turgor good   NEURO cranial nerves intact, negative rigidity, negative tremor, follows commands, motor/sensory function intact   PSYCH alert, A+O to time, place, person, good insight    Assessment/Admission Diagnosis 29 yo with epigastric/RUQ pain x 4 days.  N/V/D resolved.  Colicky.  WBC 10, U/S shows gallstones and gb wall 9  mm with some pericholecystic fluid.  Suspect clinical and radiographic cholecystitis.   Plan Admit, IVF, IV abx.  NPO after midnight.  Will likely need cholecystectomy but will defer to daytime surgeon Dr. Pat Patrick to make final decision, including possible need for further imaging studies such as HIDA.   Electronic Signatures: Floyde Parkins (MD)  (Signed 14-Jan-16 22:06)  Authored: CHIEF COMPLAINT and HISTORY, PAST MEDICAL/SURGIAL HISTORY, ALLERGIES, FAMILY AND SOCIAL HISTORY, REVIEW OF SYSTEMS, PHYSICAL EXAM, ASSESSMENT AND PLAN   Last Updated: 14-Jan-16 22:06 by Floyde Parkins (MD)

## 2015-03-02 NOTE — Discharge Summary (Signed)
PATIENT NAME:  Dorothy Chen, Dorothy Chen MR#:  [id number] DATE OF BIRTH:  [birth year]  DATE OF ADMISSION:  11/17/2014 DATE OF DISCHARGE:  11/18/2014  HOSPITAL COURSE: This is a 29 year old female who presented with significant right upper quadrant pain for a couple of days, progressive, and accompanied by some nausea and vomiting. She also had a fever. She was evaluated in the Emergency Room and noted to have finding consistent with acute cholecystitis and cholelithiasis. The patient underwent admission after initial evaluation and surgical consultation was obtained. 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  On examination, the remarkable finding was that of significant tenderness in the right upper quadrant area temperature was up to over 101. The patient had been started on IV Cipro and Flagyl. The patient was then taken to the operating room on 11/15/2014 and underwent laparoscopy and cholecystectomy with finding of an acutely inflamed gallbladder with empyema. Subsequent culture from the gallbladder did grow Salmonella and was sensitive to Cipro. Postoperative course was basically uncomplicated. In view of the significant finding of an empyema, she was kept in hospital for a couple of days and kept on IV antibiotics and subsequently switched to oral antibiotics. With the patient having improved notably, was discharged home in stable condition on 11/18/2014 to be followed up as an outpatient.   FINAL DIAGNOSES: Acute cholecystitis and cholelithiasis with empyema.   OPERATION PERFORMED: Laparoscopy and cholecystectomy.    ____________________________ S.Robinette Haines, MD sgs:bm D: 12/03/2014 18:55:45 ET T: 12/03/2014 22:53:20 ET JOB#: [id number]  cc: S.G. Jamal Collin, MD, <Dictator> [facility] Robinette Haines MD ELECTRONICALLY SIGNED 12/04/2014 18:36

## 2015-03-02 NOTE — Op Note (Signed)
PATIENT NAME:  Dorothy, Chen MR#:  [id number] DATE OF BIRTH:  07-11-1986  DATE OF PROCEDURE:  11/15/2014  PREOPERATIVE DIAGNOSIS: Acute cholecystitis and cholelithiasis.   POSTOPERATIVE DIAGNOSIS:  Acute cholecystitis and cholelithiasis with empyema of gallbladder.   OPERATION: Laparoscopy, cholecystectomy, and intraoperative cholangiogram.   ANESTHESIA: General.   COMPLICATIONS: None.   ESTIMATED BLOOD LOSS: About 50 mL.   DRAINS: None.   DESCRIPTION OF PROCEDURE: The patient was put to sleep in the supine position on the operating table. The abdomen was prepped and draped out as a sterile field. Timeout was performed. The initial port site was placed just above the umbilicus and a Veress needle with the InnerDyne sleeve was positioned through a small stab incision in the peritoneal cavity. This was verified with the hanging drop method. Pneumoperitoneum was obtained and a 10 mm port was placed. The camera was introduced. The gallbladder was noted to be distended and thickened in its wall with very a little adhesion surrounding it. Epigastric and 2 lateral 5 mm ports were placed. The gallbladder was retracted cephalad. The Hartmann pouch area was freed from some adhesions. The common duct was identified and was noted to be normal. The cystic duct was then identified and freed. The Kumar clamp and catheter were positioned in the [place] pouch area and noted there was pus-like material within the gallbladder. The cholangiogram was completed showing good filling of the bile duct both proximal and distal with no apparent obstruction to flow or any evidence of filling defects. The catheter was used to decompress the gallbladder slightly, but this only yielded a small amount of fluid. Accordingly an aspirator was then used to aspirate out some of the pus for culture. The cystic duct was then hemoclipped and cut. The cystic artery was identified, freed, hemoclipped, and cut. The gallbladder was  dissected free from its bed using cautery for control of bleeding. There were a few places where there was no tissue plane between the gallbladder and the liver bed, causing some oozing from the raw surface of the liver bed. After the gallbladder was freed the gallbladder bed was inspected and there was still some oozing noted. Because of this it was covered with 5 mL of Surgiflo. Fluid was used to irrigate out the right upper quadrant and suctioned out. The gallbladder was placed in a retrieval bag and brought out through the umbilical port site. The right upper quadrant was visualized once again and noted to be free of any bleeding and after all the fluid was suctioned out around the right side of the liver the fascial opening at the umbilicus was closed with 0 Vicryl. The pneumoperitoneum was released and the remaining ports were removed. All skin incisions were closed with subcuticular 4-0 Vicryl, reinforced with Steri-Strips, and dry sterile dressing was placed. The patient tolerated the procedure well. There were no immediate problems encountered. She was subsequently extubated and returned to the recovery room in stable condition.      ____________________________ S.Robinette Haines, MD sgs:bu D: 11/15/2014 15:26:54 ET T: 11/15/2014 16:02:35 ET JOB#: [id number]  cc: Synthia Innocent. Jamal Collin, MD, <Dictator> [facility] Robinette Haines MD ELECTRONICALLY SIGNED 11/19/2014 7:21

## 2015-03-05 ENCOUNTER — Encounter: Payer: Self-pay | Admitting: Emergency Medicine

## 2015-03-05 ENCOUNTER — Emergency Department
Admission: EM | Admit: 2015-03-05 | Discharge: 2015-03-05 | Disposition: A | Discharge status: Home / Self Care | Payer: Medicaid Other | Attending: Emergency Medicine | Admitting: Emergency Medicine

## 2015-03-05 DIAGNOSIS — L02211 Cutaneous abscess of abdominal wall: Secondary | ICD-10-CM | POA: Diagnosis not present

## 2015-03-05 DIAGNOSIS — R1110 Vomiting, unspecified: Secondary | ICD-10-CM | POA: Insufficient documentation

## 2015-03-05 DIAGNOSIS — Z720 Tobacco use: Secondary | ICD-10-CM | POA: Insufficient documentation

## 2015-03-05 DIAGNOSIS — Z880 Allergy status to penicillin: Secondary | ICD-10-CM | POA: Diagnosis not present

## 2015-03-05 DIAGNOSIS — Z3202 Encounter for pregnancy test, result negative: Secondary | ICD-10-CM | POA: Diagnosis not present

## 2015-03-05 DIAGNOSIS — L089 Local infection of the skin and subcutaneous tissue, unspecified: Secondary | ICD-10-CM | POA: Diagnosis present

## 2015-03-05 LAB — COMPREHENSIVE METABOLIC PANEL
ALK PHOS: 52 U/L (ref 38–126)
ALT: 16 U/L (ref 14–54)
ANION GAP: 8 (ref 5–15)
AST: 19 U/L (ref 15–41)
Albumin: 3.6 g/dL (ref 3.5–5.0)
BUN: 11 mg/dL (ref 6–20)
CALCIUM: 8.6 mg/dL — AB (ref 8.9–10.3)
CO2: 21 mmol/L — ABNORMAL LOW (ref 22–32)
Chloride: 109 mmol/L (ref 101–111)
Creatinine, Ser: 0.66 mg/dL (ref 0.44–1.00)
GFR calc non Af Amer: >60 mL/min (ref >60)
GLUCOSE: 110 mg/dL — AB (ref 65–99)
Potassium: 4 mmol/L (ref 3.5–5.1)
SODIUM: 138 mmol/L (ref 135–145)
Total Bilirubin: <0.1 mg/dL — ABNORMAL LOW (ref 0.3–1.2)
Total Protein: 7.2 g/dL (ref 6.5–8.1)

## 2015-03-05 LAB — CBC WITH DIFFERENTIAL/PLATELET
Basophils Absolute: 0 10*3/uL (ref 0–0.1)
Basophils Relative: 1 %
EOS PCT: 2 %
Eosinophils Absolute: 0.1 10*3/uL (ref 0–0.7)
HCT: 36.1 % (ref 35.0–47.0)
HEMOGLOBIN: 11.3 g/dL — AB (ref 12.0–16.0)
LYMPHS ABS: 2 10*3/uL (ref 1.0–3.6)
LYMPHS PCT: 31 %
MCH: 22.6 pg — AB (ref 26.0–34.0)
MCHC: 31.2 g/dL — ABNORMAL LOW (ref 32.0–36.0)
MCV: 72.5 fL — AB (ref 80.0–100.0)
Monocytes Absolute: 0.5 10*3/uL (ref 0.2–0.9)
Monocytes Relative: 7 %
NEUTROS PCT: 59 %
Neutro Abs: 3.9 10*3/uL (ref 1.4–6.5)
Platelets: 245 10*3/uL (ref 150–440)
RBC: 4.98 MIL/uL (ref 3.80–5.20)
RDW: 18.9 % — ABNORMAL HIGH (ref 11.5–14.5)
WBC: 6.6 10*3/uL (ref 3.6–11.0)

## 2015-03-05 LAB — URINALYSIS COMPLETE WITH MICROSCOPIC (ARMC ONLY)
BACTERIA UA: NONE SEEN
Bilirubin Urine: NEGATIVE
Glucose, UA: NEGATIVE mg/dL
Hgb urine dipstick: NEGATIVE
Ketones, ur: NEGATIVE mg/dL
Leukocytes, UA: NEGATIVE
NITRITE: NEGATIVE
PH: 5 (ref 5.0–8.0)
PROTEIN: NEGATIVE mg/dL
Specific Gravity, Urine: 1.028 (ref 1.005–1.030)

## 2015-03-05 LAB — POCT PREGNANCY, URINE: Preg Test, Ur: NEGATIVE

## 2015-03-05 MED ORDER — SULFAMETHOXAZOLE-TRIMETHOPRIM 800-160 MG PO TABS: 2.0000 | ORAL_TABLET | Freq: Two times a day (BID) | ORAL | Status: AC

## 2015-03-05 MED ORDER — SULFAMETHOXAZOLE-TRIMETHOPRIM 800-160 MG PO TABS
2.0000 | ORAL_TABLET | Freq: Once | ORAL | Status: AC
  Administered 2015-03-05: 2 via ORAL

## 2015-03-05 MED ORDER — MUPIROCIN 2 % EX OINT: TOPICAL_OINTMENT | CUTANEOUS | Status: AC

## 2015-03-05 MED ORDER — MUPIROCIN CALCIUM 2 % EX CREA
TOPICAL_CREAM | Freq: Once | CUTANEOUS | Status: DC
Start: 2015-03-05 — End: 2015-03-05
  Filled 2015-03-05: qty 15

## 2015-03-05 MED ORDER — BACITRACIN ZINC 500 UNIT/GM EX OINT
TOPICAL_OINTMENT | CUTANEOUS | Status: AC
  Administered 2015-03-05: 19:00:00 via TOPICAL
  Filled 2015-03-05: qty 0.9

## 2015-03-05 MED ORDER — SULFAMETHOXAZOLE-TRIMETHOPRIM 800-160 MG PO TABS
ORAL_TABLET | ORAL | Status: AC
  Filled 2015-03-05: qty 1

## 2015-03-05 NOTE — ED Notes (Signed)
Pt states she has been having some pain in lower abd. States she felt open wound to lower abd. Area with foul smelling drainage, states she vomited x 1 this am and continued to have nausea

## 2015-03-05 NOTE — ED Notes (Signed)
Pt states she just had gallbladder removed in March and had infection after surgery

## 2015-03-05 NOTE — Discharge Instructions (Signed)

## 2015-03-05 NOTE — ED Provider Notes (Signed)
[facility] Emergency Department Provider Note    Time seen: 1830  I have reviewed the triage vital signs and the nursing notes.   HISTORY  Chief Complaint Recurrent Skin Infections and Abdominal Pain    HPI Dorothy Chen is a 29 y.o. female presents to ER for abdominal wound on the lower abdominal wall.. States it has been draining and has foul smell to it. States she vomited 1 this morning and continues have nausea. Abdominal wall pain is severe, at the site of the wound. Symptoms are going on today only.     Past Medical History  Diagnosis Date  . Asthma     There are no active problems to display for this patient.   Past Surgical History  Procedure Laterality Date  . Cesarean section  2009,2011,2014  . Cholecystectomy      No current outpatient prescriptions on file.  Allergies Penicillins  No family history on file.  Social History History  Substance Use Topics  . Smoking status: Current Every Day Smoker -- 1.00 packs/day for 12 years  . Smokeless tobacco: Not on file  . Alcohol Use: Yes    Review of Systems Constitutional: Positive for fever. Eyes: Negative for visual changes. ENT: Negative for sore throat. Cardiovascular: Negative for chest pain. Respiratory: Negative for shortness of breath. Gastrointestinal: Positive for abdominal pain, vomiting  Genitourinary: Negative for dysuria. Musculoskeletal: Negative for back pain. Skin: Negative for rash. Neurological: Negative for headaches, focal weakness or numbness.  10-point ROS otherwise negative.  ____________________________________________   PHYSICAL EXAM:  VITAL SIGNS: ED Triage Vitals  Enc Vitals Group     BP --      Pulse Rate 03/05/15 1709 84     Resp 03/05/15 1709 18     Temp 03/05/15 1709 99.1 F (37.3 C)     Temp Source 03/05/15 1709 Oral     SpO2 03/05/15 1709 99 %     Weight 03/05/15 1709 189 lb (85.73 kg)     Height 03/05/15 1709 5\' 2"   (1.575 m)     Head Cir --      Peak Flow --      Pain Score 03/05/15 1709 9     Pain Loc --      Pain Edu? --      Excl. in [place]? --     Constitutional: Alert and oriented. Well appearing and in no distress. Eyes: Conjunctivae are normal. PERRL. Normal extraocular movements. ENT   Head: Normocephalic and atraumatic.   Nose: No congestion/rhinnorhea.   Mouth/Throat: Mucous membranes are moist.   Neck: No stridor. Hematological/Lymphatic/Immunilogical: No cervical lymphadenopathy. Cardiovascular: Normal rate, regular rhythm. Normal and symmetric distal pulses are present in all extremities. No murmurs, rubs, or gallops. Respiratory: Normal respiratory effort without tachypnea nor retractions. Breath sounds are clear and equal bilaterally. No wheezes/rales/rhonchi. Gastrointestinal: Soft and nontender. No distention. No abdominal bruits. There is no CVA tenderness. Musculoskeletal: Nontender with normal range of motion in all extremities. No joint effusions.  No lower extremity tenderness nor edema. Neurologic:  Normal speech and language. No gross focal neurologic deficits are appreciated. Speech is normal. No gait instability. Skin: Small superficial abscess that is spontaneously draining in the midline lower abdominal wall. Psychiatric: Mood and affect are normal. Speech and behavior are normal. Patient exhibits appropriate insight and judgment.  ____________________________________________    LABS (pertinent positives/negatives)  Normal labs no leukocytosis nonpregnant  ____________________________________________    RADIOLOGY  None  ____________________________________________  ED COURSE  Pertinent labs & imaging results that were available during my care of the patient were reviewed by me and considered in my medical decision making (see chart for details).  Bactroban and Bactrim DS given here.   FINAL ASSESSMENT AND PLAN  Abscess  Plan: Patient  discharged Bactroban to apply the area 2-3 times daily as well as Bactrim DS 2 by mouth twice a day for 10 days. Patient is given increased dosing due to multiple skin infections in the past.    Earleen Newport, MD   Earleen Newport, MD 03/05/15 [phone]

## 2015-03-11 NOTE — H&P (Signed)
L&D Evaluation:  History:  HPI 29 year old G6 p59 with EDC=01/21/2013 by a 7 week ultrasound and history of 2 prior C-sections presents at 62 1/7 weeks with c/o strong regular contractions since 11 PM last night. Her repeat C-section was scheduled for 01/16/2013. Denies VB or LOF. PNC at [facility] remarkable for MJ and tobacco use. Smokes 2-3 cigs/day. Last used MJ 28 February. First CS in 2009 for breech and was preceeded by SVD x2 in 2003 and 2007. Last CS was in 2011. LABS:   Presents with contractions   Patient's Medical History Asthma  febrile seizures as a child. Mild asthma. Abnormal Pap smears   Patient's Surgical History Csection x2 and wisdom teeth extraction   Medications Pre Natal Vitamins   Allergies PCN, hives and edema   Social History tobacco  drugs  MJ   Family History Non-Contributory   ROS:  ROS see HPI   Exam:  Vital Signs stable   Urine Protein not completed   General breathing with contractions   Mental Status clear   Chest clear   Heart normal sinus rhythm, no murmur/gallop/rubs   Abdomen gravid, tender with contractions   Fetal Position ?vtx   Edema trace pedal edema   Reflexes 1+   Pelvic 1+/TH/   Mebranes Intact   FHT normal rate with no decels   Ucx regular, q2-4 min   Impression:  Impression IUP at 38 1/7 weeks with prior C-section x2. Prabably in early labor   Plan:  Plan EFM/NST, monitor contractions and for cervical change, Fluid bolus. Preop labs.  Consulted Dr Kenton Kingfisher for repeat C-section.   Electronic Signatures: Karene Fry (CNM)  (Signed 10-Mar-14 01:45)  Authored: L&D Evaluation   Last Updated: 10-Mar-14 01:45 by Karene Fry (CNM)

## 2015-12-31 ENCOUNTER — Emergency Department: Payer: Medicaid Other

## 2015-12-31 ENCOUNTER — Encounter: Payer: Self-pay | Admitting: Emergency Medicine

## 2015-12-31 ENCOUNTER — Emergency Department
Admission: EM | Admit: 2015-12-31 | Discharge: 2015-12-31 | Disposition: A | Discharge status: Home / Self Care | Payer: Medicaid Other | Attending: Emergency Medicine | Admitting: Emergency Medicine

## 2015-12-31 DIAGNOSIS — F17200 Nicotine dependence, unspecified, uncomplicated: Secondary | ICD-10-CM | POA: Insufficient documentation

## 2015-12-31 DIAGNOSIS — X58XXXA Exposure to other specified factors, initial encounter: Secondary | ICD-10-CM | POA: Insufficient documentation

## 2015-12-31 DIAGNOSIS — Y9389 Activity, other specified: Secondary | ICD-10-CM | POA: Diagnosis not present

## 2015-12-31 DIAGNOSIS — Y9289 Other specified places as the place of occurrence of the external cause: Secondary | ICD-10-CM | POA: Insufficient documentation

## 2015-12-31 DIAGNOSIS — S46811A Strain of other muscles, fascia and tendons at shoulder and upper arm level, right arm, initial encounter: Secondary | ICD-10-CM

## 2015-12-31 DIAGNOSIS — Z880 Allergy status to penicillin: Secondary | ICD-10-CM | POA: Insufficient documentation

## 2015-12-31 DIAGNOSIS — S199XXA Unspecified injury of neck, initial encounter: Secondary | ICD-10-CM | POA: Diagnosis present

## 2015-12-31 DIAGNOSIS — S29012A Strain of muscle and tendon of back wall of thorax, initial encounter: Secondary | ICD-10-CM | POA: Diagnosis not present

## 2015-12-31 DIAGNOSIS — Y998 Other external cause status: Secondary | ICD-10-CM | POA: Diagnosis not present

## 2015-12-31 LAB — CBC
HCT: 37.8 % (ref 35.0–47.0)
Hemoglobin: 12 g/dL (ref 12.0–16.0)
MCH: 23.5 pg — ABNORMAL LOW (ref 26.0–34.0)
MCHC: 31.7 g/dL — ABNORMAL LOW (ref 32.0–36.0)
MCV: 74.3 fL — AB (ref 80.0–100.0)
PLATELETS: 255 10*3/uL (ref 150–440)
RBC: 5.09 MIL/uL (ref 3.80–5.20)
RDW: 20.8 % — AB (ref 11.5–14.5)
WBC: 7.4 10*3/uL (ref 3.6–11.0)

## 2015-12-31 LAB — BASIC METABOLIC PANEL
Anion gap: 8 (ref 5–15)
BUN: 13 mg/dL (ref 6–20)
CALCIUM: 8.7 mg/dL — AB (ref 8.9–10.3)
CO2: 21 mmol/L — ABNORMAL LOW (ref 22–32)
CREATININE: 0.58 mg/dL (ref 0.44–1.00)
Chloride: 109 mmol/L (ref 101–111)
GFR calc non Af Amer: >60 mL/min (ref >60)
Glucose, Bld: 85 mg/dL (ref 65–99)
Potassium: 4.5 mmol/L (ref 3.5–5.1)
SODIUM: 138 mmol/L (ref 135–145)

## 2015-12-31 LAB — TROPONIN I

## 2015-12-31 MED ORDER — OXYCODONE-ACETAMINOPHEN 5-325 MG PO TABS: 1.0000 | ORAL_TABLET | Freq: Four times a day (QID) | ORAL | Status: DC | PRN

## 2015-12-31 MED ORDER — IBUPROFEN 600 MG PO TABS
ORAL_TABLET | ORAL | Status: AC
  Filled 2015-12-31: qty 1

## 2015-12-31 MED ORDER — IBUPROFEN 600 MG PO TABS
600.0000 mg | ORAL_TABLET | Freq: Once | ORAL | Status: AC
  Administered 2015-12-31: 600 mg via ORAL

## 2015-12-31 NOTE — ED Notes (Signed)
Patient transported to X-ray 

## 2015-12-31 NOTE — Discharge Instructions (Signed)
Your workup today including labs are within normal limits.  Please follow up with your doctor as soon as possible regarding today's emergency department visit and your likely neck strain.  Do not drive while taking oxycodone.  Return to the emergency department if you have any worsening pain, develop any weakness/numbness of any arm/leg, confusion, slurred speech, tingling or droopy face, or sudden/severe headache.

## 2015-12-31 NOTE — ED Provider Notes (Signed)
[facility] Emergency Department Provider Note  ____________________________________________  Time seen: Approximately 8:27 AM  I have reviewed the triage vital signs and the nursing notes.   HISTORY  Chief Complaint Neck Pain; Elbow Pain; and Chest Pain    HPI Dorothy Chen is a 30 y.o. female presents for evaluation of right neck pain. The patient reports she woke up she had some abdominal "gassy" feeling and she felt very panicky. She then reports that she started getting up very quickly from bed and suddenly had sharp pain on the right side of her neck. There was no fall or injury. No numbness tingling or weakness. She reports that the muscles around the right side of her neck feel tight and very sore. She is comfortable and sitting still on her she turns her head she develops pain in the right side of the neck.  The patient denies chest pain to me. She reports that the cramps she experiences her stomach is gone away, but it sort of felt like it radiated upwards and is now gone. Denies shortness of breath. No recent trips travel. No cough. No birth control. History of blood clots. No leg swelling.   Past Medical History  Diagnosis Date  . Asthma     There are no active problems to display for this patient.   Past Surgical History  Procedure Laterality Date  . Cesarean section  2009,2011,2014  . Cholecystectomy      Current Outpatient Rx  Name  Route  Sig  Dispense  Refill  . mupirocin ointment (BACTROBAN) 2 %      Apply to affected area 3 times daily Patient not taking: Reported on 12/31/2015   22 g   0   . oxyCODONE-acetaminophen (ROXICET) 5-325 MG tablet   Oral   Take 1 tablet by mouth every 6 (six) hours as needed for severe pain.   20 tablet   0     Allergies Penicillins  No family history on file.  Social History Social History  Substance Use Topics  . Smoking status: Current Every Day Smoker -- 1.00 packs/day for 12 years   . Smokeless tobacco: None  . Alcohol Use: Yes    Review of Systems Constitutional: No fever/chills Eyes: No visual changes. ENT: No sore throat. Cardiovascular: Denies chest pain. Respiratory: Denies shortness of breath. Gastrointestinal: No abdominal pain now, had a very brief episode and resolved.  No nausea, no vomiting.  No diarrhea.  No constipation. Genitourinary: Negative for dysuria. Currently on her period. Denies pregnancy. Musculoskeletal: Negative for back pain. Skin: Negative for rash. Neurological: Negative for headaches, focal weakness or numbness.  10-point ROS otherwise negative.  ____________________________________________   PHYSICAL EXAM:  VITAL SIGNS: ED Triage Vitals  Enc Vitals Group     BP 12/31/15 0729 138/88 mmHg     Pulse Rate 12/31/15 0729 83     Resp 12/31/15 0729 16     Temp 12/31/15 0729 98.6 F (37 C)     Temp Source 12/31/15 0729 Oral     SpO2 12/31/15 0729 100 %     Weight 12/31/15 0729 199 lb (90.266 kg)     Height 12/31/15 0729 5\' 1"  (1.549 m)     Head Cir --      Peak Flow --      Pain Score 12/31/15 0730 9     Pain Loc --      Pain Edu? --      Excl. in [place]? --  Constitutional: Alert and oriented. Well appearing and in no acute distress. Eyes: Conjunctivae are normal. PERRL. EOMI. Head: Atraumatic. Nose: No congestion/rhinnorhea. Mouth/Throat: Mucous membranes are moist.  Oropharynx non-erythematous. Neck: No stridor.  No midline cervical tenderness. The patient has moderate tenderness and muscle spasm along the right cervical paraspinous muscles basically from the insertion of the trapezius of the occiput to the right upper shoulder. Her pain is relieved by sitting still, any attempt to raise the shoulder or abduct the right arm causes pain to shoot up to the muscles. Also turning her head side-to-side causes pain on the right side of the neck. There is no step-off or deformity. Cardiovascular: Normal rate, regular rhythm.  Grossly normal heart sounds.  Good peripheral circulation. Respiratory: Normal respiratory effort.  No retractions. Lungs CTAB. Gastrointestinal: Soft and nontender. No distention. No abdominal bruits. No CVA tenderness. Musculoskeletal: No lower extremity tenderness nor edema.  No joint effusions. Neurologic:  Normal speech and language. No gross focal neurologic deficits are appreciated. Skin:  Skin is warm, dry and intact. No rash noted. Psychiatric: Mood and affect are normal. Speech and behavior are normal.  ____________________________________________   LABS (all labs ordered are listed, but only abnormal results are displayed)  Labs Reviewed  BASIC METABOLIC PANEL - Abnormal; Notable for the following:    CO2 21 (*)    Calcium 8.7 (*)    All other components within normal limits  CBC - Abnormal; Notable for the following:    MCV 74.3 (*)    MCH 23.5 (*)    MCHC 31.7 (*)    RDW 20.8 (*)    All other components within normal limits  TROPONIN I   ____________________________________________  EKG  Reviewed and interpreted by me at 7:30 AM Ventricular rate 82 PR 140 QTc 410 QRS 80 Normal sinus rhythm, no significant T-wave abnormality or ischemic change noted. ____________________________________________  [id number]  DG Chest 2 View (Final result) Result time: 12/31/15 08:16:05   Final result by Rad Results In Interface (12/31/15 08:16:05)   Narrative:   CLINICAL DATA: Chest pain for 1 day radiating to left upper extremity. Shortness of breath.  EXAM: CHEST 2 VIEW  COMPARISON: Mar 25, 2008  FINDINGS: Lungs are clear. Heart size and pulmonary vascularity are normal. No adenopathy. No bone lesions. No pneumothorax.  IMPRESSION: No edema or consolidation.   Electronically Signed By: Lowella Grip III M.D. On: 12/31/2015 08:16    ____________________________________________   PROCEDURES  Procedure(s) performed: None  Critical Care  performed: No  ____________________________________________   INITIAL IMPRESSION / ASSESSMENT AND PLAN / ED COURSE  Pertinent labs & imaging results that were available during my care of the patient were reviewed by me and considered in my medical decision making (see chart for details).  Patient presents for early right-sided neck pain. She had brief episode of abdominal cramps that went away, and she states that this is a concern her and her abdominal exam is reassuring and normal at this time. She denies to me any chest pain. Her EKG is reassuring, and demonstrates no acute ischemic change.  No trauma to suggest need for imaging. Most likely seems to musculoskeletal pain primarily along the right trapezius muscle likely from straining it while sitting up quickly. She does not have any neurologic symptoms, she has a reassuring neurologic exam. Normal radial nerves in the right hand and normal median and ulnar and radial nerve exam.  Troponins negative, no acute cardiopulmonary symptoms. I doubt concerns for carotid dissection giving  no significant risk factors, young age, and what appears to be obvious musculoskeletal etiology. We will treat her symptomatically with pain control, and close follow-up. Patient is agreeable and a discussed return precautions with her which she is agreeable with.  I will prescribe the patient a narcotic pain medicine due to their condition which I anticipate will cause at least moderate pain short term. I discussed with the patient safe use of narcotic pain medicines, and that they are not to drive, work in dangerous areas, or ever take more than prescribed (no more than 1 pill every 6 hours). We discussed that this is the type of medication that can be  overdosed on and the risks of this type of medicine. Patient is very agreeable to only use as prescribed and to never use more than prescribed.  Return precautions and treatment recommendations and follow-up  discussed with the patient who is agreeable with the plan.       Pulmonary Embolism Rule-out Criteria (PERC rule)                        If YES to ANY of the following, the PERC rule is not satisfied and cannot be used to rule out PE in this patient (consider d-dimer or imaging depending on pre-test probability).                      If NO to ALL of the following, AND the clinician's pre-test probability is <15%, the [facility] rule is satisfied and there is no need for further workup (including no need to obtain a d-dimer) as the post-test probability of pulmonary embolism is <2%.                      Mnemonic is HAD CLOTS   H - hormone use (exogenous estrogen)      No. A - age > 50                                                 No. D - DVT/PE history                                      No.   C - coughing blood (hemoptysis)                 No. L - leg swelling, unilateral                             No. O - O2 Sat on Room Air < 95%                  No. T - tachycardia (HR ? 100)                         No. S - surgery or trauma, recent                      No.   Based on my evaluation of the patient, including application of this decision instrument, further testing to evaluate for pulmonary embolism is not indicated at this time. I have discussed  this recommendation with the patient who states understanding and agreement with this plan.  ____________________________________________   FINAL CLINICAL IMPRESSION(S) / ED DIAGNOSES  Final diagnoses:  Strain of right trapezius muscle, initial encounter      Delman Kitten, MD 12/31/15 1011

## 2015-12-31 NOTE — ED Notes (Signed)
Says pain in chest at 0400--now has pain in left neck and arm --especially with movement

## 2016-03-25 ENCOUNTER — Other Ambulatory Visit: Payer: Self-pay | Admitting: Primary Care

## 2016-03-25 DIAGNOSIS — N938 Other specified abnormal uterine and vaginal bleeding: Secondary | ICD-10-CM

## 2016-03-30 ENCOUNTER — Ambulatory Visit
Admission: RE | Admit: 2016-03-30 | Discharge: 2016-03-30 | Disposition: A | Discharge status: Home / Self Care | Payer: Medicaid Other | Source: Ambulatory Visit | Attending: Primary Care | Admitting: Primary Care

## 2016-03-30 DIAGNOSIS — N938 Other specified abnormal uterine and vaginal bleeding: Secondary | ICD-10-CM | POA: Diagnosis present

## 2016-09-20 ENCOUNTER — Emergency Department
Admission: EM | Admit: 2016-09-20 | Discharge: 2016-09-20 | Disposition: A | Discharge status: Home / Self Care | Payer: Medicaid Other | Attending: Emergency Medicine | Admitting: Emergency Medicine

## 2016-09-20 ENCOUNTER — Emergency Department: Payer: Medicaid Other

## 2016-09-20 DIAGNOSIS — J45909 Unspecified asthma, uncomplicated: Secondary | ICD-10-CM | POA: Diagnosis not present

## 2016-09-20 DIAGNOSIS — O209 Hemorrhage in early pregnancy, unspecified: Secondary | ICD-10-CM | POA: Insufficient documentation

## 2016-09-20 DIAGNOSIS — Z3A01 Less than 8 weeks gestation of pregnancy: Secondary | ICD-10-CM | POA: Insufficient documentation

## 2016-09-20 DIAGNOSIS — O99331 Smoking (tobacco) complicating pregnancy, first trimester: Secondary | ICD-10-CM | POA: Insufficient documentation

## 2016-09-20 DIAGNOSIS — F17200 Nicotine dependence, unspecified, uncomplicated: Secondary | ICD-10-CM | POA: Insufficient documentation

## 2016-09-20 DIAGNOSIS — N9489 Other specified conditions associated with female genital organs and menstrual cycle: Secondary | ICD-10-CM | POA: Insufficient documentation

## 2016-09-20 DIAGNOSIS — O4690 Antepartum hemorrhage, unspecified, unspecified trimester: Secondary | ICD-10-CM

## 2016-09-20 LAB — CBC
HEMATOCRIT: 35.4 % (ref 35.0–47.0)
Hemoglobin: 11.3 g/dL — ABNORMAL LOW (ref 12.0–16.0)
MCH: 22.8 pg — AB (ref 26.0–34.0)
MCHC: 31.9 g/dL — ABNORMAL LOW (ref 32.0–36.0)
MCV: 71.3 fL — AB (ref 80.0–100.0)
PLATELETS: 255 10*3/uL (ref 150–440)
RBC: 4.96 MIL/uL (ref 3.80–5.20)
RDW: 20.1 % — AB (ref 11.5–14.5)
WBC: 7.7 10*3/uL (ref 3.6–11.0)

## 2016-09-20 LAB — ABO/RH: ABO/RH(D): A POS

## 2016-09-20 LAB — HCG, QUANTITATIVE, PREGNANCY: hCG, Beta Chain, Quant, S: 61 m[IU]/mL — ABNORMAL HIGH (ref <5)

## 2016-09-20 NOTE — ED Notes (Signed)
Pt returned from [place], resting in bed, eyes closed

## 2016-09-20 NOTE — ED Provider Notes (Signed)
[facility] Emergency Department Provider Note   ____________________________________________    I have reviewed the triage vital signs and the nursing notes.    HISTORY  Chief Complaint Vaginal Bleeding     HPI Dorothy Chen is a 30 y.o. female who presents with vaginal bleeding. Patient reports she took a pregnancy test last week which was positive. She reports she had a period one month ago during her normal time but was shorter than usual. She does report mild left pelvic discomfort. She reports the bleeding comes and goes, she is not currently bleeding. She denies nausea or vomiting. She has been pregnant 7 times and has 5 children. No fevers or chills. No dysuria.   Past Medical History:  Diagnosis Date  . Asthma     There are no active problems to display for this patient.   Past Surgical History:  Procedure Laterality Date  . CESAREAN SECTION  2009,2011,2014  . CHOLECYSTECTOMY      Prior to Admission medications   Not on File     Allergies Penicillins  No family history on file.  Social History Social History  Substance Use Topics  . Smoking status: Current Every Day Smoker    Packs/day: 1.00    Years: 12.00  . Smokeless tobacco: Never Used  . Alcohol use Yes    Review of Systems  Constitutional: No fever/chills  Cardiovascular: Denies chest pain. Respiratory: Denies shortness of breath. Gastrointestinal:  No nausea, no vomiting.   Genitourinary: Negative for dysuria.Vaginal bleeding as above Musculoskeletal: Negative for back pain. Skin: Negative for pallor Neurological: Negative for headaches   10-point ROS otherwise negative.  ____________________________________________   PHYSICAL EXAM:  VITAL SIGNS: ED Triage Vitals  Enc Vitals Group     BP 09/20/16 0728 (!) 143/81     Pulse Rate 09/20/16 0728 86     Resp 09/20/16 0728 17     Temp 09/20/16 0728 98.3 F (36.8 C)     Temp Source 09/20/16 0728  Oral     SpO2 09/20/16 0728 100 %     Weight 09/20/16 0730 198 lb (89.8 kg)     Height 09/20/16 0730 5\' 5"  (1.651 m)     Head Circumference --      Peak Flow --      Pain Score 09/20/16 0730 10     Pain Loc --      Pain Edu? --      Excl. in [place]? --     Constitutional: Alert and oriented. No acute distress. Pleasant and interactive Eyes: Conjunctivae are normal.   Nose: No congestion/rhinnorhea. Mouth/Throat: Mucous membranes are moist.    Cardiovascular: Normal rate, regular rhythm. Grossly normal heart sounds.  Good peripheral circulation. Respiratory: Normal respiratory effort.  No retractions. Lungs CTAB. Gastrointestinal: Soft and nontender. No distention.  No CVA tenderness. Genitourinary: deferred Musculoskeletal: No lower extremity tenderness nor edema.  Warm and well perfused Neurologic:  Normal speech and language. No gross focal neurologic deficits are appreciated.  Skin:  Skin is warm, dry and intact. No rash noted. Psychiatric: Mood and affect are normal. Speech and behavior are normal.  ____________________________________________   LABS (all labs ordered are listed, but only abnormal results are displayed)  Labs Reviewed  HCG, QUANTITATIVE, PREGNANCY - Abnormal; Notable for the following:       Result Value   hCG, Beta Chain, Quant, S 61 (*)    All other components within normal limits  CBC - Abnormal; Notable  for the following:    Hemoglobin 11.3 (*)    MCV 71.3 (*)    MCH 22.8 (*)    MCHC 31.9 (*)    RDW 20.1 (*)    All other components within normal limits  ABO/RH   ____________________________________________  EKG  None ____________________________________________  RADIOLOGY  No IUP seen ____________________________________________   PROCEDURES  Procedure(s) performed: No    Critical Care performed: No ____________________________________________   INITIAL IMPRESSION / ASSESSMENT AND PLAN / ED COURSE  Pertinent labs & imaging  results that were available during my care of the patient were reviewed by me and considered in my medical decision making (see chart for details).  Patient presents with vaginal bleeding, we will check hCG and ABO Rh and if elevated we'll obtain ultrasound  Clinical Course   HCG is very low, no IUP seen on ultrasound, this could be because of early pregnancy versus missed miscarriage, discussed this with the patient and the need for follow-up hCG, repeat ultrasound and OB evaluation ____________________________________________   FINAL CLINICAL IMPRESSION(S) / ED DIAGNOSES  Final diagnoses:  Vaginal bleeding in pregnancy      NEW MEDICATIONS STARTED DURING THIS VISIT:  There are no discharge medications for this patient.    Note:  This document was prepared using Dragon voice recognition software and may include unintentional dictation errors.    Lavonia Drafts, MD 09/20/16 1430

## 2016-09-20 NOTE — ED Notes (Signed)
Patient transported to Ultrasound 

## 2016-09-20 NOTE — ED Notes (Signed)
EDP at bedside  

## 2016-09-20 NOTE — ED Triage Notes (Signed)
Pt states she had a positive home pregnancy test 2 weeks ago and since Friday having heavy bleeding.Marland Kitchen

## 2016-09-20 NOTE — ED Notes (Addendum)
Pt states positive home preg test last week, states vaginal bleeding that began Friday, at present pt states lower abd and back pain, states continued vaginal bleeding, pt awake and alert in no acute distress

## 2016-11-01 HISTORY — PX: CHOLECYSTECTOMY: SHX55

## 2017-08-02 ENCOUNTER — Emergency Department: Payer: Medicaid Other

## 2017-08-02 ENCOUNTER — Encounter: Payer: Self-pay | Admitting: Emergency Medicine

## 2017-08-02 ENCOUNTER — Emergency Department
Admission: EM | Admit: 2017-08-02 | Discharge: 2017-08-02 | Disposition: A | Discharge status: Home / Self Care | Payer: Medicaid Other | Attending: Emergency Medicine | Admitting: Emergency Medicine

## 2017-08-02 DIAGNOSIS — N760 Acute vaginitis: Secondary | ICD-10-CM | POA: Insufficient documentation

## 2017-08-02 DIAGNOSIS — N8302 Follicular cyst of left ovary: Secondary | ICD-10-CM | POA: Diagnosis not present

## 2017-08-02 DIAGNOSIS — N83202 Unspecified ovarian cyst, left side: Secondary | ICD-10-CM

## 2017-08-02 DIAGNOSIS — J45909 Unspecified asthma, uncomplicated: Secondary | ICD-10-CM | POA: Diagnosis not present

## 2017-08-02 DIAGNOSIS — F17210 Nicotine dependence, cigarettes, uncomplicated: Secondary | ICD-10-CM | POA: Diagnosis not present

## 2017-08-02 DIAGNOSIS — A499 Bacterial infection, unspecified: Secondary | ICD-10-CM | POA: Diagnosis not present

## 2017-08-02 DIAGNOSIS — B9689 Other specified bacterial agents as the cause of diseases classified elsewhere: Secondary | ICD-10-CM

## 2017-08-02 DIAGNOSIS — N939 Abnormal uterine and vaginal bleeding, unspecified: Secondary | ICD-10-CM

## 2017-08-02 LAB — CHLAMYDIA/NGC RT PCR (ARMC ONLY)
CHLAMYDIA TR: NOT DETECTED
N GONORRHOEAE: NOT DETECTED

## 2017-08-02 LAB — CBC WITH DIFFERENTIAL/PLATELET
BASOS PCT: 1 %
Basophils Absolute: 0 10*3/uL (ref 0–0.1)
EOS ABS: 0.3 10*3/uL (ref 0–0.7)
EOS PCT: 3 %
HCT: 34 % — ABNORMAL LOW (ref 35.0–47.0)
HEMOGLOBIN: 11.1 g/dL — AB (ref 12.0–16.0)
Lymphocytes Relative: 23 %
Lymphs Abs: 2.1 10*3/uL (ref 1.0–3.6)
MCH: 23.3 pg — AB (ref 26.0–34.0)
MCHC: 32.6 g/dL (ref 32.0–36.0)
MCV: 71.6 fL — ABNORMAL LOW (ref 80.0–100.0)
Monocytes Absolute: 0.8 10*3/uL (ref 0.2–0.9)
Monocytes Relative: 9 %
NEUTROS PCT: 64 %
Neutro Abs: 5.7 10*3/uL (ref 1.4–6.5)
Platelets: 232 10*3/uL (ref 150–440)
RBC: 4.74 MIL/uL (ref 3.80–5.20)
RDW: 19.8 % — ABNORMAL HIGH (ref 11.5–14.5)
WBC: 8.8 10*3/uL (ref 3.6–11.0)

## 2017-08-02 LAB — POCT PREGNANCY, URINE: PREG TEST UR: NEGATIVE

## 2017-08-02 LAB — WET PREP, GENITAL
Sperm: NONE SEEN
Trich, Wet Prep: NONE SEEN
YEAST WET PREP: NONE SEEN

## 2017-08-02 MED ORDER — MEDROXYPROGESTERONE ACETATE 10 MG PO TABS
10.0000 mg | ORAL_TABLET | Freq: Once | ORAL | Status: AC
  Administered 2017-08-02: 10 mg via ORAL
  Filled 2017-08-02: qty 1

## 2017-08-02 MED ORDER — MEDROXYPROGESTERONE ACETATE 10 MG PO TABS: 10.0000 mg | ORAL_TABLET | Freq: Every day | ORAL | 0 refills | Status: DC

## 2017-08-02 MED ORDER — METRONIDAZOLE 500 MG PO TABS: 500.0000 mg | ORAL_TABLET | Freq: Two times a day (BID) | ORAL | 0 refills | Status: DC

## 2017-08-02 NOTE — ED Provider Notes (Signed)
[facility] Emergency Department Provider Note       Time seen: ----------------------------------------- 9:38 AM on 08/02/2017 -----------------------------------------     I have reviewed the triage vital signs and the nursing notes.   HISTORY   Chief Complaint Vaginal Bleeding    HPI Dorothy Chen is a 31 y.o. female who presents to the ED for vaginal bleeding. Patient reports she's had vaginal bleeding and pain to the pelvic area. She had a normal menstrual cycle 2 weeks ago but then started bleeding again 12 days ago and has not stopped. She's been seeing some clots, currently she's not sure if she is pregnant or not. She reports feeling weak at times due to the bleeding. She bled like this in the past when she had a miscarriage. She's been using about 1 pad per hour.  Past Medical History:  Diagnosis Date  . Asthma     There are no active problems to display for this patient.   Past Surgical History:  Procedure Laterality Date  . CESAREAN SECTION  2009,2011,2014  . CHOLECYSTECTOMY      Allergies Penicillins  Social History Social History  Substance Use Topics  . Smoking status: Current Every Day Smoker    Packs/day: 1.00    Years: 12.00  . Smokeless tobacco: Never Used  . Alcohol use Yes    Review of Systems Constitutional: Negative for fever. Eyes: Negative for vision changes ENT:  Negative for congestion, sore throat Cardiovascular: Negative for chest pain. Respiratory: Negative for shortness of breath. Gastrointestinal: positive for abdominal pain Genitourinary: positive for abnormal vaginal bleeding Musculoskeletal: Negative for back pain. Skin: Negative for rash. Neurological: Negative for headaches, focal weakness or numbness.  All systems negative/normal/unremarkable except as stated in the HPI  ____________________________________________   PHYSICAL EXAM:  VITAL SIGNS: ED Triage Vitals  Enc Vitals Group      BP 08/02/17 0839 133/77     Pulse Rate 08/02/17 0839 88     Resp 08/02/17 0839 18     Temp 08/02/17 0839 98.2 F (36.8 C)     Temp Source 08/02/17 0839 Oral     SpO2 08/02/17 0839 100 %     Weight 08/02/17 0840 200 lb (90.7 kg)     Height 08/02/17 0840 5\' 1"  (1.549 m)     Head Circumference --      Peak Flow --      Pain Score 08/02/17 0839 9     Pain Loc --      Pain Edu? --      Excl. in [place]? --     Constitutional: Alert and oriented. Well appearing and in no distress. Eyes: Conjunctivae are normal. Normal extraocular movements. ENT   Head: Normocephalic and atraumatic.   Nose: No congestion/rhinnorhea.   Mouth/Throat: Mucous membranes are moist.   Neck: No stridor. Cardiovascular: Normal rate, regular rhythm. No murmurs, rubs, or gallops. Respiratory: Normal respiratory effort without tachypnea nor retractions. Breath sounds are clear and equal bilaterally. No wheezes/rales/rhonchi. Gastrointestinal: Soft and nontender. Normal bowel sounds Musculoskeletal: Nontender with normal range of motion in extremities. No lower extremity tenderness nor edema. Neurologic:  Normal speech and language. No gross focal neurologic deficits are appreciated.  Skin:  Skin is warm, dry and intact. No rash noted. Psychiatric: Mood and affect are normal. Speech and behavior are normal.  ____________________________________________  ED COURSE:  Pertinent labs & imaging results that were available during my care of the patient were reviewed by me and considered in  my medical decision making (see chart for details). Patient presents for abnormal vaginal bleeding, we will assess with labs and consider imaging as indicated.   Procedures ____________________________________________   LABS (pertinent positives/negatives)  Labs Reviewed  CBC WITH DIFFERENTIAL/PLATELET - Abnormal; Notable for the following:       Result Value   Hemoglobin 11.1 (*)    HCT 34.0 (*)    MCV 71.6 (*)     MCH 23.3 (*)    RDW 19.8 (*)    All other components within normal limits  POCT PREGNANCY, URINE  POC URINE PREG, ED    RADIOLOGY pelvic ultrasound  IMPRESSION: 1. There is a 4.5 cm simple appearing cystic lesion in the left adnexa, likely representing a simple ovarian cyst, although no definite normal ovarian tissue is identified around this cyst. Given the size, and patient's age, this is almost certainly benign, and no specific imaging follow up is recommended according to the Society of Radiologists in Ultrasound 2010 Consensus Conference Statement (D Clovis Riley et al. Management of Asymptomatic Ovarian and Other Adnexal Cysts Imaged at [place]: Society of Radiologists in [place] 2010. Radiology 256 (Sept 2010): 943-954.). 2. Normal sonographic appearance of the uterus and right ovary.  ____________________________________________  DIFFERENTIAL DIAGNOSIS   miscarriage, ectopic pregnancy, intrauterine pregnancy, abnormal vaginal bleeding, ovarian cyst  FINAL ASSESSMENT AND PLAN  abnormal vaginal bleeding  Plan: Patient's labs and imaging were dictated above. Patient had presented for abnormal vaginal bleeding and we have started her on Provera. She also appears to have bacterial vaginosis and ovarian cyst which will need to be followed. She'll be given Flagyl for the bacterial vaginosis but overall is stable for outpatient follow-up.   Earleen Newport, MD   Note: This note was generated in part or whole with voice recognition software. Voice recognition is usually quite accurate but there are transcription errors that can and very often do occur. I apologize for any typographical errors that were not detected and corrected.     Earleen Newport, MD 08/02/17 1131

## 2017-08-02 NOTE — ED Notes (Signed)
Pt c/o abd cramping with vaginal bleeding. States she had her normal cycle 9/13 for 5 days and then started again after only 4 days and has been bleeding since.Marland Kitchen

## 2017-08-02 NOTE — ED Notes (Signed)
Called pharmacy for provera.

## 2017-08-02 NOTE — ED Triage Notes (Signed)
Pt c/o vaginal bleeding and pain to pelvis area/vagina.  Normal period 9-13-9/17 but then started bleeding again 9/20 and has not stopped.  Has seen some clots.  Feels weak at times from all the bleeding. Bled like this before when had miscarriage.  Not pregnant that knows of.  Uses about 1 pad per hour per pt.  ambulatory to triage without difficulty.

## 2017-08-04 ENCOUNTER — Telehealth: Payer: Self-pay | Admitting: Emergency Medicine

## 2017-08-04 NOTE — Telephone Encounter (Signed)
Called patient in response to voicemail she left.  Wanted std test results.  Gave her results.

## 2018-04-19 ENCOUNTER — Encounter: Payer: Self-pay | Admitting: Primary Care

## 2018-04-20 ENCOUNTER — Encounter: Payer: Self-pay | Admitting: Gastroenterology

## 2018-11-12 ENCOUNTER — Emergency Department
Admission: EM | Admit: 2018-11-12 | Discharge: 2018-11-12 | Disposition: A | Discharge status: Home / Self Care | Payer: Medicaid Other | Attending: Emergency Medicine | Admitting: Emergency Medicine

## 2018-11-12 ENCOUNTER — Other Ambulatory Visit: Payer: Self-pay

## 2018-11-12 ENCOUNTER — Encounter: Payer: Self-pay | Admitting: Emergency Medicine

## 2018-11-12 DIAGNOSIS — J45909 Unspecified asthma, uncomplicated: Secondary | ICD-10-CM | POA: Diagnosis not present

## 2018-11-12 DIAGNOSIS — J020 Streptococcal pharyngitis: Secondary | ICD-10-CM | POA: Diagnosis not present

## 2018-11-12 DIAGNOSIS — F17210 Nicotine dependence, cigarettes, uncomplicated: Secondary | ICD-10-CM | POA: Diagnosis not present

## 2018-11-12 DIAGNOSIS — J029 Acute pharyngitis, unspecified: Secondary | ICD-10-CM | POA: Diagnosis present

## 2018-11-12 MED ORDER — ACETAMINOPHEN-CODEINE #3 300-30 MG PO TABS: 1.0000 | ORAL_TABLET | ORAL | 0 refills | Status: DC | PRN

## 2018-11-12 MED ORDER — AZITHROMYCIN 250 MG PO TABS: ORAL_TABLET | ORAL | 0 refills | Status: DC

## 2018-11-12 MED ORDER — ACETAMINOPHEN-CODEINE #3 300-30 MG PO TABS
2.0000 | ORAL_TABLET | Freq: Once | ORAL | Status: AC
  Administered 2018-11-12: 2 via ORAL
  Filled 2018-11-12: qty 2

## 2018-11-12 MED ORDER — AZITHROMYCIN 500 MG PO TABS
500.0000 mg | ORAL_TABLET | Freq: Once | ORAL | Status: AC
  Administered 2018-11-12: 500 mg via ORAL
  Filled 2018-11-12: qty 1

## 2018-11-12 NOTE — Discharge Instructions (Signed)
Please call and schedule follow-up appointment with Dr. Tami Ribas if you are not improving over the next 2 to 3 days.  Return to the emergency department immediately if you have difficulty swallowing, noticed change in your voice, or pain intensifies despite taking your medication.

## 2018-11-12 NOTE — ED Provider Notes (Signed)
[facility] Emergency Department Provider Note  ____________________________________________  Time seen: Approximately 7:47 PM  I have reviewed the triage vital signs and the nursing notes.   HISTORY  Chief Complaint Sore Throat and Fever    HPI Dorothy Chen is a 33 y.o. female who presents to the emergency department for treatment and evaluation of sore throat started approximately 1 week ago.  Over the past 2 days, she has developed left ear pain.  Patient reports having had a peritonsillar abscess drained but is unable to recall how many years ago it has been. She has taken [facility] Powder without relief. She denies fever or other concerning symptoms.  Past Medical History:  Diagnosis Date  . Asthma     There are no active problems to display for this patient.   Past Surgical History:  Procedure Laterality Date  . CESAREAN SECTION  2009,2011,2014  . CHOLECYSTECTOMY      Prior to Admission medications   Medication Sig Start Date End Date Taking? Authorizing Provider  acetaminophen-codeine (TYLENOL #3) 300-30 MG tablet Take 1-2 tablets by mouth every 4 (four) hours as needed for moderate pain. 11/12/18   Moss Berry B, FNP  azithromycin (ZITHROMAX) 250 MG tablet 1 tablet for the next 4 days. 11/12/18   Cornie Mccomber, Johnette Abraham B, FNP  medroxyPROGESTERone (PROVERA) 10 MG tablet Take 1 tablet (10 mg total) by mouth daily. 08/02/17   Earleen Newport, MD  metroNIDAZOLE (FLAGYL) 500 MG tablet Take 1 tablet (500 mg total) by mouth 2 (two) times daily. 08/02/17   Earleen Newport, MD    Allergies Penicillins  History reviewed. No pertinent family history.  Social History Social History   Tobacco Use  . Smoking status: Current Every Day Smoker    Packs/day: 1.00    Years: 12.00    Pack years: 12.00  . Smokeless tobacco: Never Used  Substance Use Topics  . Alcohol use: Yes  . Drug use: No    Review of Systems Constitutional: Negative for  fever. Eyes: No visual changes. ENT: Positive for sore throat; positive for painful swallowing. Positive for left ear pain. Respiratory: Denies shortness of breath. Gastrointestinal: Negative for abdominal pain.  No nausea, no vomiting.  No diarrhea.  Genitourinary: Negative for dysuria. Negative for decrease in need to void. Musculoskeletal: Negative for generalized body aches. Skin: Negative for rash. Neurological: Negative for headaches, negative for focal weakness or numbness.  ____________________________________________   PHYSICAL EXAM:  VITAL SIGNS: ED Triage Vitals [11/12/18 1832]  Enc Vitals Group     BP 140/88     Pulse Rate 87     Resp 18     Temp 99.7 F (37.6 C)     Temp Source Oral     SpO2 99 %     Weight 207 lb (93.9 kg)     Height 5\' 1"  (1.549 m)     Head Circumference      Peak Flow      Pain Score      Pain Loc      Pain Edu?      Excl. in [place]?     Constitutional: Alert and oriented. Well appearing and in no acute distress. Eyes: Conjunctivae are normal.  Head: Atraumatic. Nose: No congestion/rhinnorhea. Mouth/Throat: Mucous membranes are moist.  Oropharynx erythematous, tonsils 1+on the right and 2+on the left with exudate. Uvula is midline. Ears: Right tympanic membrane appears pearly. Left tympanic membrane appears pearly. Neck: No stridor. Voice clear. Lymphatic: Anterior cervical  nodes palpable and tender in the anterior cervical nodes. Cardiovascular: Normal rate, regular rhythm. Good peripheral circulation. Respiratory: Normal respiratory effort. Lungs CTAB. Gastrointestinal: Soft and nontender. Musculoskeletal: FROM of neck, upper and lower extremities. Neurologic:  Normal speech and language. No gross focal neurologic deficits are appreciated. Skin:  Skin is warm, dry and intact. No rash noted Psychiatric: Mood and affect are normal. Speech and behavior are normal.  ____________________________________________   LABS (all labs ordered  are listed, but only abnormal results are displayed)  Labs Reviewed - No data to display ____________________________________________  EKG  Not indiated ____________________________________________  RADIOLOGY  Not indicated. ____________________________________________   PROCEDURES  Procedure(s) performed: None  Critical Care performed: No ____________________________________________   INITIAL IMPRESSION / ASSESSMENT AND PLAN / ED COURSE  33 year old female presenting to the emergency department for treatment and evaluation of sore throat and earache.  Exam shows tonsils are both enlarged left greater than right with exudate.  She will be treated with azithromycin as she has a documented anaphylactic allergy to amoxicillin.  She has had a previous peritonsillar abscess that required incision and drainage, but I am unable to find any record of the date or who she saw.  She will be referred to Dr. Tami Ribas who is on-call today if she is not improving over the next 2 to 3 days while taking the antibiotics.  She was encouraged to return to the emergency department immediately if she begins to have any noticeable change in her voice, increase in pain, or inability to swallow her saliva.  Pertinent labs & imaging results that were available during my care of the patient were reviewed by me and considered in my medical decision making (see chart for details). ____________________________________________  Discharge Medication List as of 11/12/2018  7:57 PM    START taking these medications   Details  acetaminophen-codeine (TYLENOL #3) 300-30 MG tablet Take 1-2 tablets by mouth every 4 (four) hours as needed for moderate pain., Starting Sun 11/12/2018, Normal    azithromycin (ZITHROMAX) 250 MG tablet 1 tablet for the next 4 days., Normal        FINAL CLINICAL IMPRESSION(S) / ED DIAGNOSES  Final diagnoses:  Strep pharyngitis    If controlled substance prescribed during this visit,  12 month history viewed on the [place] prior to issuing an initial prescription for Schedule II or III opiod.   Note:  This document was prepared using Dragon voice recognition software and may include unintentional dictation errors.    Victorino Dike, FNP 11/12/18 2245    Carrie Mew, MD 11/13/18 0000

## 2018-11-12 NOTE — ED Triage Notes (Signed)
Pt c/o cough, sore throat, and fever x several days. Pt also c/o ear pain due to sore throat.

## 2018-11-13 ENCOUNTER — Emergency Department
Admission: EM | Admit: 2018-11-13 | Discharge: 2018-11-13 | Discharge status: Against Medical Advice | Payer: Medicaid Other | Attending: Emergency Medicine | Admitting: Emergency Medicine

## 2018-11-13 ENCOUNTER — Other Ambulatory Visit: Payer: Self-pay

## 2018-11-13 DIAGNOSIS — Z5321 Procedure and treatment not carried out due to patient leaving prior to being seen by health care provider: Secondary | ICD-10-CM | POA: Diagnosis not present

## 2018-11-13 DIAGNOSIS — J029 Acute pharyngitis, unspecified: Secondary | ICD-10-CM | POA: Diagnosis not present

## 2018-11-13 NOTE — ED Triage Notes (Signed)
Reports seen in ED earlier today and diagnosed with tonsillitis.  Reports no better and feels like throat closing.  Patient is speaking in complete sentences, no acute respiratory distress noted.

## 2019-02-10 ENCOUNTER — Other Ambulatory Visit: Payer: Self-pay

## 2019-02-10 ENCOUNTER — Emergency Department: Payer: Medicaid Other

## 2019-02-10 ENCOUNTER — Emergency Department
Admission: EM | Admit: 2019-02-10 | Discharge: 2019-02-10 | Disposition: A | Discharge status: Home / Self Care | Payer: Medicaid Other | Attending: Student in an Organized Health Care Education/Training Program | Admitting: Student in an Organized Health Care Education/Training Program

## 2019-02-10 DIAGNOSIS — R102 Pelvic and perineal pain: Secondary | ICD-10-CM | POA: Insufficient documentation

## 2019-02-10 DIAGNOSIS — N939 Abnormal uterine and vaginal bleeding, unspecified: Secondary | ICD-10-CM | POA: Diagnosis not present

## 2019-02-10 DIAGNOSIS — Z79899 Other long term (current) drug therapy: Secondary | ICD-10-CM | POA: Diagnosis not present

## 2019-02-10 DIAGNOSIS — J45909 Unspecified asthma, uncomplicated: Secondary | ICD-10-CM | POA: Diagnosis not present

## 2019-02-10 DIAGNOSIS — F17210 Nicotine dependence, cigarettes, uncomplicated: Secondary | ICD-10-CM | POA: Insufficient documentation

## 2019-02-10 LAB — COMPREHENSIVE METABOLIC PANEL
ALT: 19 U/L (ref 0–44)
AST: 20 U/L (ref 15–41)
Albumin: 4.2 g/dL (ref 3.5–5.0)
Alkaline Phosphatase: 59 U/L (ref 38–126)
Anion gap: 10 (ref 5–15)
BUN: 12 mg/dL (ref 6–20)
CO2: 26 mmol/L (ref 22–32)
Calcium: 9.3 mg/dL (ref 8.9–10.3)
Chloride: 105 mmol/L (ref 98–111)
Creatinine, Ser: 0.76 mg/dL (ref 0.44–1.00)
GFR calc Af Amer: >60 mL/min (ref >60)
GFR calc non Af Amer: >60 mL/min (ref >60)
Glucose, Bld: 92 mg/dL (ref 70–99)
Potassium: 3.7 mmol/L (ref 3.5–5.1)
Sodium: 141 mmol/L (ref 135–145)
Total Bilirubin: 0.6 mg/dL (ref 0.3–1.2)
Total Protein: 8 g/dL (ref 6.5–8.1)

## 2019-02-10 LAB — URINALYSIS, COMPLETE (UACMP) WITH MICROSCOPIC
Bilirubin Urine: NEGATIVE
Glucose, UA: NEGATIVE mg/dL
Hgb urine dipstick: NEGATIVE
Ketones, ur: NEGATIVE mg/dL
Nitrite: NEGATIVE
Protein, ur: NEGATIVE mg/dL
Specific Gravity, Urine: 1.028 (ref 1.005–1.030)
pH: 5 (ref 5.0–8.0)

## 2019-02-10 LAB — CBC
HCT: 38.4 % (ref 36.0–46.0)
Hemoglobin: 11.6 g/dL — ABNORMAL LOW (ref 12.0–15.0)
MCH: 22 pg — ABNORMAL LOW (ref 26.0–34.0)
MCHC: 30.2 g/dL (ref 30.0–36.0)
MCV: 72.7 fL — ABNORMAL LOW (ref 80.0–100.0)
Platelets: 324 10*3/uL (ref 150–400)
RBC: 5.28 MIL/uL — ABNORMAL HIGH (ref 3.87–5.11)
RDW: 20.6 % — ABNORMAL HIGH (ref 11.5–15.5)
WBC: 6.3 10*3/uL (ref 4.0–10.5)
nRBC: 0 % (ref 0.0–0.2)

## 2019-02-10 LAB — HCG, QUANTITATIVE, PREGNANCY: hCG, Beta Chain, Quant, S: <1 m[IU]/mL (ref <5)

## 2019-02-10 LAB — LIPASE, BLOOD: Lipase: 23 U/L (ref 11–51)

## 2019-02-10 NOTE — ED Triage Notes (Signed)
Pt states positive pregnancy test on 3/20. States vaginal bleeding x 10 days that began 3/23, stopped bleeding a few days ago. Here for low abd pain x 4 days. Denies urinary symptoms. A&O, ambulatory.

## 2019-02-10 NOTE — ED Notes (Signed)
Pt given urine sample cup and informed of need for urine sample.

## 2019-02-10 NOTE — ED Provider Notes (Signed)
[facility] Emergency Department Provider Note  ____________________________________________  Time seen: Approximately 4:26 PM  I have reviewed the triage vital signs and the nursing notes.   HISTORY  Chief Complaint No chief complaint on file.    HPI Dorothy Chen is a 33 y.o. female G8P5 presents to the emergency department with pelvic cramping and vaginal bleeding for the past 2 days.  Patient reports that 1 week ago, she had a positive urine pregnancy test.  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  She has had no nausea, vomiting or fever.  She denies dysuria, increased urinary frequency or back pain. No other alleviating measures have been attempted.         Past Medical History:  Diagnosis Date  . Asthma     There are no active problems to display for this patient.   Past Surgical History:  Procedure Laterality Date  . CESAREAN SECTION  2009,2011,2014  . CHOLECYSTECTOMY      Prior to Admission medications   Medication Sig Start Date End Date Taking? Authorizing Provider  acetaminophen-codeine (TYLENOL #3) 300-30 MG tablet Take 1-2 tablets by mouth every 4 (four) hours as needed for moderate pain. 11/12/18   Triplett, Cari B, FNP  azithromycin (ZITHROMAX) 250 MG tablet 1 tablet for the next 4 days. 11/12/18   Triplett, Johnette Abraham B, FNP  medroxyPROGESTERone (PROVERA) 10 MG tablet Take 1 tablet (10 mg total) by mouth daily. 08/02/17   Earleen Newport, MD  metroNIDAZOLE (FLAGYL) 500 MG tablet Take 1 tablet (500 mg total) by mouth 2 (two) times daily. 08/02/17   Earleen Newport, MD    Allergies Penicillins  History reviewed. No pertinent family history.  Social History Social History   Tobacco Use  . Smoking status: Current Every Day Smoker    Packs/day: 1.00    Years: 12.00    Pack years: 12.00  . Smokeless tobacco: Never Used  Substance Use Topics  . Alcohol use: Yes   . Drug use: No     Review of Systems  Constitutional: No fever/chills Eyes: No visual changes. No discharge ENT: No upper respiratory complaints. Cardiovascular: no chest pain. Respiratory: no cough. No SOB. Gastrointestinal: Patient has pelvic pain.  No nausea, no vomiting.  No diarrhea.  No constipation. Genitourinary: Patient has vaginal bleeding.  Musculoskeletal: Negative for musculoskeletal pain. Skin: Negative for rash, abrasions, lacerations, ecchymosis. Neurological: Negative for headaches, focal weakness or numbness.   ____________________________________________   PHYSICAL EXAM:  VITAL SIGNS: ED Triage Vitals [02/10/19 1600]  Enc Vitals Group     BP 139/83     Pulse Rate 78     Resp 18     Temp 99.3 F (37.4 C)     Temp Source Oral     SpO2 100 %     Weight 200 lb (90.7 kg)     Height 5' (1.524 m)     Head Circumference      Peak Flow      Pain Score 6     Pain Loc      Pain Edu?      Excl. in [place]?      Constitutional: Alert and oriented. Well appearing and in no acute distress. Eyes: Conjunctivae are normal. PERRL. EOMI. Head: Atraumatic.  Cardiovascular: Normal rate, regular rhythm. Normal S1 and S2.  Good peripheral circulation. Respiratory: Normal respiratory effort without tachypnea or retractions. Lungs CTAB. Good air entry to  the bases with no decreased or absent breath sounds. Gastrointestinal: Bowel sounds 4 quadrants.  Patient has tenderness along the right and left lower quadrants.  No guarding or rigidity. No palpable masses. No distention. No CVA tenderness. Musculoskeletal: Full range of motion to all extremities. No gross deformities appreciated. Neurologic:  Normal speech and language. No gross focal neurologic deficits are appreciated.  Skin:  Skin is warm, dry and intact. No rash noted. Psychiatric: Mood and affect are normal. Speech and behavior are normal. Patient exhibits appropriate insight and  judgement.   ____________________________________________   LABS (all labs ordered are listed, but only abnormal results are displayed)  Labs Reviewed  CBC - Abnormal; Notable for the following components:      Result Value   RBC 5.28 (*)    Hemoglobin 11.6 (*)    MCV 72.7 (*)    MCH 22.0 (*)    RDW 20.6 (*)    All other components within normal limits  URINALYSIS, COMPLETE (UACMP) WITH MICROSCOPIC - Abnormal; Notable for the following components:   Color, Urine YELLOW (*)    APPearance HAZY (*)    Leukocytes,Ua TRACE (*)    Bacteria, UA RARE (*)    All other components within normal limits  LIPASE, BLOOD  COMPREHENSIVE METABOLIC PANEL  HCG, QUANTITATIVE, PREGNANCY  POC URINE PREG, ED   ____________________________________________  EKG   ____________________________________________  RADIOLOGY I personally viewed and evaluated these images as part of my medical decision making, as well as reviewing the written report by the radiologist.    US Pelvic Complete W Transvaginal And Torsion R/o  Result Date: 02/10/2019 CLINICAL DATA:  Acute pelvic pain, vaginal bleeding. EXAM: TRANSABDOMINAL AND TRANSVAGINAL ULTRASOUND OF PELVIS DOPPLER ULTRASOUND OF OVARIES TECHNIQUE: Both transabdominal and transvaginal ultrasound examinations of the pelvis were performed. Transabdominal technique was performed for global imaging of the pelvis including uterus, ovaries, adnexal regions, and pelvic cul-de-sac. It was necessary to proceed with endovaginal exam following the transabdominal exam to visualize the endometrium and ovaries. Color and duplex Doppler ultrasound was utilized to evaluate blood flow to the ovaries. COMPARISON:  Ultrasound of August 02, 2017. FINDINGS: Uterus Measurements: 10.7 x 6.2 x 4.7 cm = volume: 16 mL. No fibroids or other mass visualized. Endometrium Thickness: 1 mm which is within normal limits. No focal abnormality visualized. Right ovary Measurements: 2.8 x 2.6 x  1.3 cm = volume: 5 mL. Normal appearance/no adnexal mass. Left ovary Measurements: 2.2 x 1.6 x 1.4 cm = volume: 2.5 mL. Normal appearance/no adnexal mass. Pulsed Doppler evaluation of both ovaries demonstrates normal low-resistance arterial and venous waveforms. Other findings No abnormal free fluid. IMPRESSION: No abnormality seen in the pelvis. Electronically Signed   By: Marijo Conception, M.D.   On: 02/10/2019 18:56    ____________________________________________    PROCEDURES  Procedure(s) performed:    Procedures    Medications - No data to display   ____________________________________________   INITIAL IMPRESSION / ASSESSMENT AND PLAN / ED COURSE  Pertinent labs & imaging results that were available during my care of the patient were reviewed by me and considered in my medical decision making (see chart for details).  Review of the [place] CSRS was performed in accordance of the [place] prior to dispensing any controlled drugs.           Assessment and Plan: Vaginal bleeding 33 year old female presents to the emergency department with vaginal bleeding for the past 7 to 10 days.  On physical exam, patient was  hemodynamically stable and was resting comfortably with only mild tenderness with deep palpation in the right and left lower abdominal quadrants.  Differential diagnosis included miscarriage, hemorrhagic cyst, intrauterine pregnancy and ovarian torsion.  Beta-hCG was less than 1 in the emergency department and pelvic ultrasound revealed no evidence of hemorrhagic cyst or ovarian torsion.  Patient was advised to follow-up with [facility] regarding vaginal bleeding.  All patient questions were answered.    ____________________________________________  FINAL CLINICAL IMPRESSION(S) / ED DIAGNOSES  Final diagnoses:  Vaginal bleeding      NEW MEDICATIONS STARTED DURING THIS VISIT:  ED Discharge Orders    None          This chart was dictated using  voice recognition software/Dragon. Despite best efforts to proofread, errors can occur which can change the meaning. Any change was purely unintentional.    Lannie Fields, PA-C 02/10/19 1911    Merlyn Lot, MD 02/10/19 Sharilyn Sites

## 2019-09-05 ENCOUNTER — Emergency Department
Admission: EM | Admit: 2019-09-05 | Discharge: 2019-09-05 | Disposition: A | Discharge status: Home / Self Care | Payer: Medicaid Other | Attending: Emergency Medicine | Admitting: Emergency Medicine

## 2019-09-05 ENCOUNTER — Encounter: Payer: Self-pay | Admitting: Emergency Medicine

## 2019-09-05 ENCOUNTER — Other Ambulatory Visit: Payer: Self-pay

## 2019-09-05 ENCOUNTER — Emergency Department: Payer: Medicaid Other

## 2019-09-05 DIAGNOSIS — F17210 Nicotine dependence, cigarettes, uncomplicated: Secondary | ICD-10-CM | POA: Diagnosis not present

## 2019-09-05 DIAGNOSIS — J029 Acute pharyngitis, unspecified: Secondary | ICD-10-CM | POA: Diagnosis present

## 2019-09-05 DIAGNOSIS — J45909 Unspecified asthma, uncomplicated: Secondary | ICD-10-CM | POA: Insufficient documentation

## 2019-09-05 DIAGNOSIS — J36 Peritonsillar abscess: Secondary | ICD-10-CM | POA: Insufficient documentation

## 2019-09-05 LAB — CBC WITH DIFFERENTIAL/PLATELET
Abs Immature Granulocytes: 0.03 10*3/uL (ref 0.00–0.07)
Basophils Absolute: 0 10*3/uL (ref 0.0–0.1)
Basophils Relative: 0 %
Eosinophils Absolute: 0.1 10*3/uL (ref 0.0–0.5)
Eosinophils Relative: 0 %
HCT: 37.6 % (ref 36.0–46.0)
Hemoglobin: 11.7 g/dL — ABNORMAL LOW (ref 12.0–15.0)
Immature Granulocytes: 0 %
Lymphocytes Relative: 15 %
Lymphs Abs: 1.8 10*3/uL (ref 0.7–4.0)
MCH: 23.4 pg — ABNORMAL LOW (ref 26.0–34.0)
MCHC: 31.1 g/dL (ref 30.0–36.0)
MCV: 75.4 fL — ABNORMAL LOW (ref 80.0–100.0)
Monocytes Absolute: 0.7 10*3/uL (ref 0.1–1.0)
Monocytes Relative: 6 %
Neutro Abs: 9 10*3/uL — ABNORMAL HIGH (ref 1.7–7.7)
Neutrophils Relative %: 79 %
Platelets: 250 10*3/uL (ref 150–400)
RBC: 4.99 MIL/uL (ref 3.87–5.11)
RDW: 18 % — ABNORMAL HIGH (ref 11.5–15.5)
WBC: 11.6 10*3/uL — ABNORMAL HIGH (ref 4.0–10.5)
nRBC: 0 % (ref 0.0–0.2)

## 2019-09-05 LAB — BASIC METABOLIC PANEL
Anion gap: 10 (ref 5–15)
BUN: 8 mg/dL (ref 6–20)
CO2: 24 mmol/L (ref 22–32)
Calcium: 9 mg/dL (ref 8.9–10.3)
Chloride: 104 mmol/L (ref 98–111)
Creatinine, Ser: 0.71 mg/dL (ref 0.44–1.00)
GFR calc Af Amer: >60 mL/min (ref >60)
GFR calc non Af Amer: >60 mL/min (ref >60)
Glucose, Bld: 90 mg/dL (ref 70–99)
Potassium: 3.7 mmol/L (ref 3.5–5.1)
Sodium: 138 mmol/L (ref 135–145)

## 2019-09-05 LAB — POCT PREGNANCY, URINE: Preg Test, Ur: NEGATIVE

## 2019-09-05 MED ORDER — CLINDAMYCIN HCL 300 MG PO CAPS: 300.0000 mg | ORAL_CAPSULE | Freq: Three times a day (TID) | ORAL | 0 refills | Status: AC

## 2019-09-05 MED ORDER — CLINDAMYCIN PHOSPHATE 900 MG/50ML IV SOLN
900.0000 mg | Freq: Once | INTRAVENOUS | Status: AC
  Administered 2019-09-05: 20:00:00 900 mg via INTRAVENOUS
  Filled 2019-09-05: qty 50

## 2019-09-05 MED ORDER — MORPHINE SULFATE (PF) 4 MG/ML IV SOLN
4.0000 mg | Freq: Once | INTRAVENOUS | Status: AC
  Administered 2019-09-05: 16:00:00 4 mg via INTRAVENOUS
  Filled 2019-09-05: qty 1

## 2019-09-05 MED ORDER — SODIUM CHLORIDE 0.9 % IV BOLUS: 1000.0000 mL | Freq: Once | INTRAVENOUS | Status: DC

## 2019-09-05 MED ORDER — LORAZEPAM 2 MG/ML IJ SOLN
INTRAMUSCULAR | Status: AC
  Administered 2019-09-05: 19:00:00 1 mg via INTRAVENOUS
  Filled 2019-09-05: qty 1

## 2019-09-05 MED ORDER — ONDANSETRON HCL 4 MG/2ML IJ SOLN
4.0000 mg | Freq: Once | INTRAMUSCULAR | Status: AC
  Administered 2019-09-05: 16:00:00 4 mg via INTRAVENOUS
  Filled 2019-09-05: qty 2

## 2019-09-05 MED ORDER — DEXAMETHASONE SODIUM PHOSPHATE 10 MG/ML IJ SOLN
10.0000 mg | Freq: Once | INTRAMUSCULAR | Status: AC
  Administered 2019-09-05: 19:00:00 10 mg via INTRAVENOUS
  Filled 2019-09-05: qty 1

## 2019-09-05 MED ORDER — LORAZEPAM 2 MG/ML IJ SOLN
1.0000 mg | Freq: Once | INTRAMUSCULAR | Status: AC
  Administered 2019-09-05: 19:00:00 1 mg via INTRAVENOUS

## 2019-09-05 MED ORDER — PREDNISONE 10 MG PO TABS: 10.0000 mg | ORAL_TABLET | Freq: Every day | ORAL | 0 refills | Status: DC

## 2019-09-05 MED ORDER — LIDOCAINE HCL (PF) 1 % IJ SOLN
INTRAMUSCULAR | Status: AC
  Administered 2019-09-05: 19:00:00
  Filled 2019-09-05: qty 5

## 2019-09-05 MED ORDER — BENZOCAINE 20 % MT AERO
1.0000 "application " | INHALATION_SPRAY | Freq: Once | OROMUCOSAL | Status: DC
  Filled 2019-09-05 (×2): qty 57

## 2019-09-05 MED ORDER — IOHEXOL 300 MG/ML  SOLN
75.0000 mL | Freq: Once | INTRAMUSCULAR | Status: AC | PRN
  Administered 2019-09-05: 16:00:00 75 mL via INTRAVENOUS
  Filled 2019-09-05: qty 75

## 2019-09-05 NOTE — ED Triage Notes (Addendum)
Pt in via POV, reports ongoing sore throat x three weeks, currently on day 5 of Zpac but without any relief.  Reports hx of same with tonsillar abscess.  Ambulatory to triage.  NAD noted at this time.

## 2019-09-05 NOTE — ED Notes (Signed)
Pt. Going home with family. 

## 2019-09-05 NOTE — Consult Note (Signed)
Dorothy Chen, Dorothy Chen [id number] May 12, 1986 Dorothy Nearing, MD  Reason for Consult: left peritonsillar abscess Requesting Physician: Harvest Dark, MD Consulting Physician: Dorothy Nearing, MD  HPI: This 33 y.o. year old female was admitted on 09/05/2019 for sore throat. She had been on zithromax for a sore throat but this had worsened. She reports prior h/o tonsillitis and had a PTA drained in the past, maybe 1-2 years ago.   Allergies:  Allergies  Allergen Reactions  . Amoxicillin Anaphylaxis  . Penicillins Anaphylaxis    Has patient had a PCN reaction causing immediate rash, facial/tongue/throat swelling, SOB or lightheadedness with hypotension: Yes Has patient had a PCN reaction causing severe rash involving mucus membranes or skin necrosis: Yes Has patient had a PCN reaction that required hospitalization Yes Has patient had a PCN reaction occurring within the last 10 years: No If all of the above answers are "NO", then may proceed with Cephalosporin use.     Medications: (Not in a hospital admission) .  Current Facility-Administered Medications  Medication Dose Route Frequency Provider Last Rate Last Dose  . lidocaine (PF) (XYLOCAINE) 1 % injection            Current Outpatient Medications  Medication Sig Dispense Refill  . acetaminophen-codeine (TYLENOL #3) 300-30 MG tablet Take 1-2 tablets by mouth every 4 (four) hours as needed for moderate pain. 12 tablet 0  . azithromycin (ZITHROMAX) 250 MG tablet 1 tablet for the next 4 days. 4 each 0  . clindamycin (CLEOCIN) 300 MG capsule Take 1 capsule (300 mg total) by mouth 3 (three) times daily for 10 days. 30 capsule 0  . medroxyPROGESTERone (PROVERA) 10 MG tablet Take 1 tablet (10 mg total) by mouth daily. 10 tablet 0  . metroNIDAZOLE (FLAGYL) 500 MG tablet Take 1 tablet (500 mg total) by mouth 2 (two) times daily. 14 tablet 0    PMH:  Past Medical History:  Diagnosis Date  . Asthma     Fam Hx: No family history on  file.  Soc Hx:  Social History   Socioeconomic History  . Marital status: Single    Spouse name: Not on file  . Number of children: Not on file  . Years of education: Not on file  . Highest education level: Not on file  Occupational History  . Not on file  Social Needs  . Financial resource strain: Not on file  . Food insecurity    Worry: Not on file    Inability: Not on file  . Transportation needs    Medical: Not on file    Non-medical: Not on file  Tobacco Use  . Smoking status: Current Every Day Smoker    Packs/day: 0.50    Years: 12.00    Pack years: 6.00  . Smokeless tobacco: Never Used  Substance and Sexual Activity  . Alcohol use: Yes  . Drug use: No  . Sexual activity: Not on file  Lifestyle  . Physical activity    Days per week: Not on file    Minutes per session: Not on file  . Stress: Not on file  Relationships  . Social Herbalist on phone: Not on file    Gets together: Not on file    Attends religious service: Not on file    Active member of club or organization: Not on file    Attends meetings of clubs or organizations: Not on file    Relationship status: Not on file  . Intimate  partner violence    Fear of current or ex partner: Not on file    Emotionally abused: Not on file    Physically abused: Not on file    Forced sexual activity: Not on file  Other Topics Concern  . Not on file  Social History Narrative  . Not on file    PSH:  Past Surgical History:  Procedure Laterality Date  . CESAREAN SECTION  2009,2011,2014  . CHOLECYSTECTOMY    . Procedures since admission: No admission procedures for hospital encounter.  ROS: Review of systems normal other than 12 systems except per HPI.  PHYSICAL EXAM Vitals:  Vitals:   09/05/19 1800 09/05/19 1805  BP: 137/79   Pulse: 69   Resp: 19   Temp:  99.4 F (37.4 C)  SpO2: 99%   .  General: Well-developed, Well-nourished in no acute distress Mood: Mood and affect well adjusted,  pleasant and cooperative. Orientation: Grossly alert and oriented. Vocal Quality: No hoarseness. Communicates verbally. head and Face: NCAT. No facial asymmetry. No visible skin lesions. No significant facial scars. No tenderness with sinus percussion. Facial strength normal and symmetric. Ears: External ears with normal landmarks, no lesions. External auditory canals free of infection, cerumen impaction or lesions. Tympanic membranes intact with good landmarks and normal mobility on pneumatic otoscopy. No middle ear effusion. Hearing: Speech reception grossly normal. Nose: External nose normal with midline dorsum and no lesions or deformity. Nasal Cavity reveals essentially midline septum with normal inferior turbinates. No significant mucosal congestion or erythema. Nasal secretions are minimal and clear. No polyps seen on anterior rhinoscopy. Oral Cavity/ Oropharynx: Lips are normal with no lesions. Teeth no frank dental caries. Gingiva healthy with no lesions or gingivitis. Left tonsil and palatal swelling consistent with peritonsillar abscess. Indirect Laryngoscopy/Nasopharyngoscopy: Visualization of the larynx, hypopharynx and nasopharynx is not possible in this setting with routine examination. Neck: Supple and symmetric with no palpable masses, tenderness or crepitance. The trachea is midline. Thyroid gland is soft, nontender and symmetric with no masses or enlargement. Parotid and submandibular glands are soft, nontender and symmetric, without masses. Lymphatic: reactive lymphadenopathy. Respiratory: Normal respiratory effort without labored breathing. Cardiovascular: Carotid pulse shows regular rate and rhythm Neurologic: Cranial Nerves II through XII are grossly intact. Eyes: Gaze and Ocular Motility are grossly normal. PERRLA. No visible nystagmus.  MEDICAL DECISION MAKING: Data Review:  Results for orders placed or performed during the hospital encounter of 09/05/19 (from the past 48  hour(s))  CBC with Differential     Status: Abnormal   Collection Time: 09/05/19  2:56 PM  Result Value Ref Range   WBC 11.6 (H) 4.0 - 10.5 K/uL   RBC 4.99 3.87 - 5.11 MIL/uL   Hemoglobin 11.7 (L) 12.0 - 15.0 g/dL   HCT 37.6 36.0 - 46.0 %   MCV 75.4 (L) 80.0 - 100.0 fL   MCH 23.4 (L) 26.0 - 34.0 pg   MCHC 31.1 30.0 - 36.0 g/dL   RDW 18.0 (H) 11.5 - 15.5 %   Platelets 250 150 - 400 K/uL   nRBC 0.0 0.0 - 0.2 %   Neutrophils Relative % 79 %   Neutro Abs 9.0 (H) 1.7 - 7.7 K/uL   Lymphocytes Relative 15 %   Lymphs Abs 1.8 0.7 - 4.0 K/uL   Monocytes Relative 6 %   Monocytes Absolute 0.7 0.1 - 1.0 K/uL   Eosinophils Relative 0 %   Eosinophils Absolute 0.1 0.0 - 0.5 K/uL   Basophils Relative 0 %  Basophils Absolute 0.0 0.0 - 0.1 K/uL   Immature Granulocytes 0 %   Abs Immature Granulocytes 0.03 0.00 - 0.07 K/uL    Comment: Performed at [facility], [address] [place], [place] [id number]  Basic metabolic panel     Status: None   Collection Time: 09/05/19  2:56 PM  Result Value Ref Range   Sodium 138 135 - 145 mmol/L   Potassium 3.7 3.5 - 5.1 mmol/L   Chloride 104 98 - 111 mmol/L   CO2 24 22 - 32 mmol/L   Glucose, Bld 90 70 - 99 mg/dL   BUN 8 6 - 20 mg/dL   Creatinine, Ser 0.71 0.44 - 1.00 mg/dL   Calcium 9.0 8.9 - 10.3 mg/dL   GFR calc non Af Amer >60 >60 mL/min   GFR calc Af Amer >60 >60 mL/min   Anion gap 10 5 - 15    Comment: Performed at [facility], [address], [place], [place] [address]  Pregnancy, urine POC     Status: None   Collection Time: 09/05/19  4:08 PM  Result Value Ref Range   Preg Test, Ur NEGATIVE NEGATIVE    Comment:        THE SENSITIVITY OF THIS METHODOLOGY IS >24 mIU/mL   . Ct Soft Tissue Neck W Contrast  Result Date: 09/05/2019 CLINICAL DATA:  Left throat pain/swelling for 3 weeks. Concern for peritonsillar abscess. EXAM: CT NECK WITH CONTRAST TECHNIQUE: Multidetector CT imaging of the neck was performed using the  standard protocol following the bolus administration of intravenous contrast. CONTRAST:  55mL OMNIPAQUE IOHEXOL 300 MG/ML  SOLN COMPARISON:  07/19/2014 FINDINGS: Pharynx and larynx: There is prominent asymmetric enlargement of the left palatine tonsil with a rim enhancing anterior peritonsillar fluid collection measuring 2.6 x 2.1 x 2.3 cm. There is mild narrowing of the upper oropharyngeal airway. There is no retropharyngeal fluid collection. The epiglottis and larynx are unremarkable. Salivary glands: No inflammation, mass, or stone. Thyroid: Unremarkable. Lymph nodes: Mildly enlarged left level II and III lymph nodes measuring up to 1.3 cm in short axis, likely reactive. Vascular: Major vascular structures of the neck are patent. Limited intracranial: Unremarkable. Visualized orbits: Partially visualized depression of the left lamina papyracea suggestive of an old medial orbital fracture. Mastoids and visualized paranasal sinuses: Clear. Skeleton: Multiple dental caries, including a large cavity involving the right maxillary second molar tooth with extensive periapical lucency but no evidence of acute inflammation in the surrounding soft tissues. Upper chest: Clear lung apices. Other: None. IMPRESSION: 2.6 cm left peritonsillar abscess. Electronically Signed   By: Logan Bores M.D.   On: 09/05/2019 17:07  .   PROCEDURE: Preoperative Diagnosis: left peritonsillar abscess Postoperative Diagnosis: Same Procedure: Incision and drainage of left peritonsillar abscess Indications: Left peritonsillar abscess Findings:4cc of pus drained from abscess Description of Procedure: After discussing procedure and risks  (primarily bleeding) with the patient, the throat anesthetized with topical Hurricane spray and the region around the left tonsil injected with 1% lidocaine with epinephrine, 1:100,000. An 18 gage needle was used to aspirate above the tonsil, aspirating approximately 3 cc of purulence. This area was then  opened with a 15 blade and a hemostat used to carefully bluntly open the peritonsillar space and drain and additional 1cc or more of loculated purulence. The patient tolerated the procedure well, though she did require some IV ativan administered by Dr. Kerman Passey to help her relax and get through the procedure.   ASSESSMENT: Left  peritonsillar abscess  PLAN: The abscess was successfully drained. Recommend Clindamycin 300mg  PO QID x 10 days and can f/u next week in office. A Sterapred DS 6 days taper would help with rapid reduction of swelling. She should consider future tonsillectomy  Dorothy Nearing, MD 09/05/2019 6:48 PM

## 2019-09-05 NOTE — ED Provider Notes (Signed)
[facility] Emergency Department Provider Note  Time seen: 4:06 PM  I have reviewed the triage vital signs and the nursing notes.   HISTORY  Chief Complaint Sore Throat   HPI Dorothy Chen is a 33 y.o. female with a past medical history of asthma who presents to the emergency department for left sore throat.  According to the patient for the past 3 weeks she has been experiencing a sore throat mostly in the left side.  States over the past 1 week the pain and swelling of gotten worse.  Denies any known fever, states he finally went to her doctor this past Friday and was placed on azithromycin.  Patient states no better after the antibiotic so she came to the emergency department today.  Denies any cough or shortness of breath.  No abdominal pain vomiting or diarrhea.  Largely negative review of systems otherwise.   Past Medical History:  Diagnosis Date  . Asthma     There are no active problems to display for this patient.   Past Surgical History:  Procedure Laterality Date  . CESAREAN SECTION  2009,2011,2014  . CHOLECYSTECTOMY      Prior to Admission medications   Medication Sig Start Date End Date Taking? Authorizing Provider  acetaminophen-codeine (TYLENOL #3) 300-30 MG tablet Take 1-2 tablets by mouth every 4 (four) hours as needed for moderate pain. 11/12/18   Triplett, Cari B, FNP  azithromycin (ZITHROMAX) 250 MG tablet 1 tablet for the next 4 days. 11/12/18   Triplett, Johnette Abraham B, FNP  medroxyPROGESTERone (PROVERA) 10 MG tablet Take 1 tablet (10 mg total) by mouth daily. 08/02/17   Earleen Newport, MD  metroNIDAZOLE (FLAGYL) 500 MG tablet Take 1 tablet (500 mg total) by mouth 2 (two) times daily. 08/02/17   Earleen Newport, MD    Allergies  Allergen Reactions  . Amoxicillin Anaphylaxis  . Penicillins Anaphylaxis    Has patient had a PCN reaction causing immediate rash, facial/tongue/throat swelling, SOB or lightheadedness with hypotension:  Yes Has patient had a PCN reaction causing severe rash involving mucus membranes or skin necrosis: Yes Has patient had a PCN reaction that required hospitalization Yes Has patient had a PCN reaction occurring within the last 10 years: No If all of the above answers are "NO", then may proceed with Cephalosporin use.     No family history on file.  Social History Social History   Tobacco Use  . Smoking status: Current Every Day Smoker    Packs/day: 0.50    Years: 12.00    Pack years: 6.00  . Smokeless tobacco: Never Used  Substance Use Topics  . Alcohol use: Yes  . Drug use: No    Review of Systems Constitutional: Negative for fever. ENT: Positive for sore throat left greater than right. Cardiovascular: Negative for chest pain. Respiratory: Negative for shortness of breath. Gastrointestinal: Negative for abdominal pain, vomiting  Genitourinary: Negative for urinary compaints Musculoskeletal: Negative for musculoskeletal complaints Skin: Negative for skin complaints  Neurological: Negative for headache All other ROS negative  ____________________________________________   PHYSICAL EXAM:  VITAL SIGNS: ED Triage Vitals [09/05/19 1448]  Enc Vitals Group     BP (!) 109/95     Pulse Rate 83     Resp 16     Temp 99.3 F (37.4 C)     Temp Source Oral     SpO2 98 %     Weight 209 lb (94.8 kg)     Height 5'  1" (1.549 m)     Head Circumference      Peak Flow      Pain Score 9     Pain Loc      Pain Edu?      Excl. in [place]?    Constitutional: Alert and oriented. Well appearing and in no distress. Eyes: Normal exam ENT      Head: Normocephalic and atraumatic.      Mouth/Throat: Mucous membranes are moist.  Patient has significant left tonsillar swelling minimal right tonsillar swelling.  Significant left tonsillar erythema no obvious exudates. Cardiovascular: Normal rate, regular rhythm.  Respiratory: Normal respiratory effort without tachypnea nor retractions. Breath  sounds are clear Gastrointestinal: Soft and nontender. No distention.   Musculoskeletal: Nontender with normal range of motion in all extremities.  Neurologic:  Normal speech and language. No gross focal neurologic deficits  Skin:  Skin is warm, dry and intact.  Psychiatric: Mood and affect are normal.   ____________________________________________   RADIOLOGY  CT consistent with 2.6 cm left PTA  ____________________________________________   INITIAL IMPRESSION / ASSESSMENT AND PLAN / ED COURSE  Pertinent labs & imaging results that were available during my care of the patient were reviewed by me and considered in my medical decision making (see chart for details).   Patient presents to the emergency department for sore throat left greater than right.  Differential would include tonsillitis, pharyngitis, PTA less likely retropharyngeal abscess.  Exam is most consistent with PTA we will obtain CT with contrast to confirm suspicions and to assess size and location.  Patient agreeable to plan of care.  We will dose pain and nausea medication while awaiting CT results.  CT consistent with 2.6 cm left PTA.  Discussed the patient with Dr. Richardson Landry will be in to see the patient for drainage.  Anticipate likely discharge following drainage.  Dr. Richardson Landry was able to drain the PTA successfully in the emergency department.  I did dose the patient 1 mg of Ativan for extreme anxiety during the procedure.  Patient will be discharged home with clindamycin and prednisone.   Dorothy Chen was evaluated in Emergency Department on 09/05/2019 for the symptoms described in the history of present illness. She was evaluated in the context of the global COVID-19 pandemic, which necessitated consideration that the patient might be at risk for infection with the SARS-CoV-2 virus that causes COVID-19. Institutional protocols and algorithms that pertain to the evaluation of patients at risk for COVID-19 are in a  state of rapid change based on information released by regulatory bodies including the CDC and federal and state organizations. These policies and algorithms were followed during the patient's care in the ED.  ____________________________________________   FINAL CLINICAL IMPRESSION(S) / ED DIAGNOSES  Left peritonsillar abscess   Harvest Dark, MD 09/05/19 2057

## 2019-09-13 ENCOUNTER — Encounter: Payer: Self-pay | Admitting: *Deleted

## 2019-09-13 ENCOUNTER — Other Ambulatory Visit: Payer: Self-pay

## 2019-09-17 ENCOUNTER — Other Ambulatory Visit: Payer: Self-pay

## 2019-09-17 ENCOUNTER — Other Ambulatory Visit
Admission: RE | Admit: 2019-09-17 | Discharge: 2019-09-17 | Disposition: A | Discharge status: Home / Self Care | Payer: Medicaid Other | Source: Ambulatory Visit | Attending: Otolaryngology | Admitting: Otolaryngology

## 2019-09-17 DIAGNOSIS — Z01812 Encounter for preprocedural laboratory examination: Secondary | ICD-10-CM | POA: Diagnosis not present

## 2019-09-17 DIAGNOSIS — Z20828 Contact with and (suspected) exposure to other viral communicable diseases: Secondary | ICD-10-CM | POA: Diagnosis not present

## 2019-09-17 LAB — SARS CORONAVIRUS 2 (TAT 6-24 HRS): SARS Coronavirus 2: NEGATIVE

## 2019-09-17 NOTE — Anesthesia Preprocedure Evaluation (Addendum)
Anesthesia Evaluation  Patient identified by MRN, date of birth, ID band Patient awake    Reviewed: Allergy & Precautions, NPO status , Patient's Chart, lab work & pertinent test results  History of Anesthesia Complications Negative for: history of anesthetic complications  Airway Mallampati: II  TM Distance: >3 FB Neck ROM: Full    Dental  (+)    Pulmonary asthma , Current Smoker (1/2-1 ppd) and Patient abstained from smoking.,    breath sounds clear to auscultation       Cardiovascular (-) angina(-) DOE  Rhythm:Regular Rate:Normal     Neuro/Psych Seizures -,  PSYCHIATRIC DISORDERS Depression    GI/Hepatic GERD  Controlled,  Endo/Other    Renal/GU      Musculoskeletal   Abdominal (+) + obese (BMI 41),   Peds  Hematology  (+) anemia ,   Anesthesia Other Findings   Reproductive/Obstetrics                           Anesthesia Physical Anesthesia Plan  ASA: III  Anesthesia Plan: General   Post-op Pain Management:    Induction: Intravenous  PONV Risk Score and Plan: 2 and Ondansetron, Dexamethasone, Midazolam and Treatment may vary due to age or medical condition  Airway Management Planned: Oral ETT  Additional Equipment:   Intra-op Plan:   Post-operative Plan: Extubation in OR  Informed Consent: I have reviewed the patients History and Physical, chart, labs and discussed the procedure including the risks, benefits and alternatives for the proposed anesthesia with the patient or authorized representative who has indicated his/her understanding and acceptance.       Plan Discussed with: CRNA and Anesthesiologist  Anesthesia Plan Comments:        Anesthesia Quick Evaluation

## 2019-09-17 NOTE — Discharge Instructions (Signed)
T & A INSTRUCTION SHEET - [facility]  Margaretha Sheffield, MD  [address] [place], [place] [address] TEL.  [phone]  INFORMATION SHEET FOR A TONSILLECTOMY AND ADENDOIDECTOMY  About Your Tonsils and Adenoids  The tonsils and adenoids are normal body tissues that are part of our immune system.  They normally help to protect [place] against diseases that may enter our mouth and nose. However, sometimes the tonsils and/or adenoids become too large and obstruct our breathing, especially at night.    If either of these things happen it helps to remove the tonsils and adenoids in order to become healthier. The operation to remove the tonsils and adenoids is called a tonsillectomy and adenoidectomy.  The Location of Your Tonsils and Adenoids  The tonsils are located in the back of the throat on both side and sit in a cradle of muscles. The adenoids are located in the roof of the mouth, behind the nose, and closely associated with the opening of the Eustachian tube to the ear.  Surgery on Tonsils and Adenoids  A tonsillectomy and adenoidectomy is a short operation which takes about thirty minutes.  This includes being put to sleep and being awakened. Tonsillectomies and adenoidectomies are performed at [facility] and may require observation period in the recovery room prior to going home. Children are required to remain in recovery for at least 45 minutes.   Following the Operation for a Tonsillectomy  A cautery machine is used to control bleeding. Bleeding from a tonsillectomy and adenoidectomy is minimal and postoperatively the risk of bleeding is approximately four percent, although this rarely life threatening.  After your tonsillectomy and adenoidectomy post-op care at home: 1. Our patients are able to go home the same day. You may be given prescriptions for pain medications, if indicated. 2. It is extremely important to  remember that fluid intake is of utmost importance after a tonsillectomy. The amount that you drink must be maintained in the postoperative period. A good indication of whether a child is getting enough fluid is whether his/her urine output is constant. As long as children are urinating or wetting their diaper every 6 - 8 hours this is usually enough fluid intake.   3. Although rare, this is a risk of some bleeding in the first ten days after surgery. This usually occurs between day five and nine postoperatively. This risk of bleeding is approximately four percent. If you or your child should have any bleeding you should remain calm and notify our office or go directly to the emergency room at [facility] where they will contact us. Our doctors are available seven days a week for notification. We recommend sitting up quietly in a chair, place an ice pack on the front of the neck and spitting out the blood gently until we are able to contact you. Adults should gargle gently with ice water and this may help stop the bleeding. If the bleeding does not stop after a short time, i.e. 10 to 15 minutes, or seems to be increasing again, please contact us or go to the hospital.   4. It is common for the pain to be worse at 5 - 7 days postoperatively. This occurs because the scab is peeling off and the mucous membrane (skin of the throat) is growing back where the tonsils were.   5. It is common for a low-grade fever, less than 102, during the first week  after a tonsillectomy and adenoidectomy. It is usually due to not drinking enough liquids, and we suggest your use liquid Tylenol (acetaminophen) or the pain medicine with Tylenol (acetaminophen) prescribed in order to keep your temperature below 102. Please follow the directions on the back of the bottle. 6. Recommendations for post-operative pain in children and adults: a) For Children 12 and younger: Recommendations are for oral Tylenol  (acetaminophen) and oral Motrin (ibuprofen). Administer the Tylenol (acetaminophen) and Motrin as stated on bottle for patient's age/weight. Sometimes it may be necessary to alternate the Tylenol (acetaminophen) and Motrin for improved pain control. Motrin (ibuprofen) does last slightly longer so many patients benefit from being given this prior to bedtime. All children should avoid Aspirin products for 2 weeks following surgery. b) For children over the age of 31: Tylenol (acetaminophen) is the preferred first choice for pain control. Depending on your child's size, sometimes they will be given a combination of Tylenol (acetaminophen) and hydrocodone medication or sometimes it will be recommended they take Motrin (ibuprofen) in addition to the Tylenol (acetaminophen). Narcotics should always be used with caution in children following surgery as they can suppress their breathing and switching to over the counter Tylenol (acetaminophen) and Motrin (ibuprofen) as soon as possible is recommended. All patients should avoid Aspirin products for 2 weeks following surgery. c) Adults: Usually adults will require a narcotic pain medication following a tonsillectomy. This usually has either hydrocodone or oxycodone in it and can usually be taken every 4 to 6 hours as needed for moderate pain. If the medication does not have Tylenol (acetaminophen) in it, you may also supplement Tylenol (acetaminophen) as needed every 4 to 6 hours for breakthrough or mild pain. Adults should avoid Aspirin, Aleve, Motrin, and Ibuprofen products for 2 weeks following surgery as they can increase your risk of bleeding. 7. If you happen to look in the mirror or into your child's mouth you will see white/gray patches on the back of the throat. This is what a scab looks like in the mouth and is normal after having a tonsillectomy and adenoidectomy. They will disappear once the tonsil areas heal completely. However, it may cause a noticeable odor,  and this too will disappear with time.     8. You or your child may experience ear pain after having a tonsillectomy and adenoidectomy.  This is called referred pain and comes from the throat, but it is felt in the ears.  Ear pain is quite common and expected. It will usually go away after ten days. There is usually nothing wrong with the ears, and it is primarily due to the healing area stimulating the nerve to the ear that runs along the side of the throat. Use either the prescribed pain medicine or Tylenol (acetaminophen) as needed.  9. The throat tissues after a tonsillectomy are obviously sensitive. Smoking around children who have had a tonsillectomy significantly increases the risk of bleeding. DO NOT SMOKE!  What to Expect Each Day  First Day at Home 1. Patients will be discharged home the same day.  2. Drink at least four glasses of liquid a day. Clear, cool liquids are recommended. Fruit juices containing citric acid are not recommended because they tend to cause pain. Carbonated beverages are allowed if you pour them from glass to glass to remove the bubbles as these tend to cause discomfort. Avoid alcoholic beverages.  3. Eat very soft foods such as soups, broth, jello, custard, pudding, ice cream, popsicles, applesauce, mashed potatoes,  and in general anything that you can crush between your tongue and the roof of your mouth. Try adding Carnation Instant Breakfast Mix into your food for extra calories. It is not uncommon to lose 5 to 10 pounds of fluid weight. The weight will be gained back quickly once you're feeling better and drinking more.  °4. Sleep with your head elevated on two pillows for about three days to help decrease the swelling.  °5. DO NOT SMOKE!  °Day Two  °1. Rest as much as possible. Use common sense in your activities.  °2. Continue drinking at least four glasses of liquid per day.  °3. Follow the soft diet.  °4. Use your pain medication as needed.  °Day Three  °1. Advance  your activity as you are able and continue to follow the previous day's suggestions.  °Days Four Through Six  °1. Advance your diet and begin to eat more solid foods such as chopped hamburger. °2. Advance your activities slowly. Children should be kept mostly around the house.  °3. Not uncommonly, there will be more pain at this time. It is temporary, usually lasting a day or two.  °Day Seven Through Ten  °1. Most individuals by this time are able to return to work or school unless otherwise instructed. Consider sending children back to school for a half day on the first day back. ° ° °General Anesthesia, Adult, Care After °This sheet gives you information about how to care for yourself after your procedure. Your health care provider may also give you more specific instructions. If you have problems or questions, contact your health care provider. °What can I expect after the procedure? °After the procedure, the following side effects are common: °· Pain or discomfort at the IV site. °· Nausea. °· Vomiting. °· Sore throat. °· Trouble concentrating. °· Feeling cold or chills. °· Weak or tired. °· Sleepiness and fatigue. °· Soreness and body aches. These side effects can affect parts of the body that were not involved in surgery. °Follow these instructions at home: ° °For at least 24 hours after the procedure: °· Have a responsible adult stay with you. It is important to have someone help care for you until you are awake and alert. °· Rest as needed. °· Do not: °? Participate in activities in which you could fall or become injured. °? Drive. °? Use heavy machinery. °? Drink alcohol. °? Take sleeping pills or medicines that cause drowsiness. °? Make important decisions or sign legal documents. °? Take care of children on your own. °Eating and drinking °· Follow any instructions from your health care provider about eating or drinking restrictions. °· When you feel hungry, start by eating small amounts of foods that are  soft and easy to digest (bland), such as toast. Gradually return to your regular diet. °· Drink enough fluid to keep your urine pale yellow. °· If you vomit, rehydrate by drinking water, juice, or clear broth. °General instructions °· If you have sleep apnea, surgery and certain medicines can increase your risk for breathing problems. Follow instructions from your health care provider about wearing your sleep device: °? Anytime you are sleeping, including during daytime naps. °? While taking prescription pain medicines, sleeping medicines, or medicines that make you drowsy. °· Return to your normal activities as told by your health care provider. Ask your health care provider what activities are safe for you. °· Take over-the-counter and prescription medicines only as told by your health care provider. °· If   you smoke, do not smoke without supervision. °· Keep all follow-up visits as told by your health care provider. This is important. °Contact a health care provider if: °· You have nausea or vomiting that does not get better with medicine. °· You cannot eat or drink without vomiting. °· You have pain that does not get better with medicine. °· You are unable to pass urine. °· You develop a skin rash. °· You have a fever. °· You have redness around your IV site that gets worse. °Get help right away if: °· You have difficulty breathing. °· You have chest pain. °· You have blood in your urine or stool, or you vomit blood. °Summary °· After the procedure, it is common to have a sore throat or nausea. It is also common to feel tired. °· Have a responsible adult stay with you for the first 24 hours after general anesthesia. It is important to have someone help care for you until you are awake and alert. °· When you feel hungry, start by eating small amounts of foods that are soft and easy to digest (bland), such as toast. Gradually return to your regular diet. °· Drink enough fluid to keep your urine pale  yellow. °· Return to your normal activities as told by your health care provider. Ask your health care provider what activities are safe for you. °This information is not intended to replace advice given to you by your health care provider. Make sure you discuss any questions you have with your health care provider. °Document Released: 01/24/2001 Document Revised: 10/21/2017 Document Reviewed: 06/03/2017 °Elsevier Patient Education © 2020 [employer] ° °

## 2019-09-20 ENCOUNTER — Ambulatory Visit: Payer: Medicaid Other | Admitting: Anesthesiology

## 2019-09-20 ENCOUNTER — Ambulatory Visit
Admission: RE | Admit: 2019-09-20 | Discharge: 2019-09-20 | Disposition: A | Discharge status: Home / Self Care | Payer: Medicaid Other | Attending: Otolaryngology | Admitting: Otolaryngology

## 2019-09-20 ENCOUNTER — Encounter
Admission: RE | Disposition: A | Discharge status: Home / Self Care | Payer: Self-pay | Source: Home / Self Care | Attending: Otolaryngology

## 2019-09-20 DIAGNOSIS — J3501 Chronic tonsillitis: Secondary | ICD-10-CM | POA: Diagnosis not present

## 2019-09-20 DIAGNOSIS — K219 Gastro-esophageal reflux disease without esophagitis: Secondary | ICD-10-CM | POA: Insufficient documentation

## 2019-09-20 DIAGNOSIS — R069 Unspecified abnormalities of breathing: Secondary | ICD-10-CM | POA: Diagnosis not present

## 2019-09-20 DIAGNOSIS — J45909 Unspecified asthma, uncomplicated: Secondary | ICD-10-CM | POA: Diagnosis not present

## 2019-09-20 DIAGNOSIS — H9202 Otalgia, left ear: Secondary | ICD-10-CM | POA: Diagnosis not present

## 2019-09-20 DIAGNOSIS — F17210 Nicotine dependence, cigarettes, uncomplicated: Secondary | ICD-10-CM | POA: Insufficient documentation

## 2019-09-20 DIAGNOSIS — F329 Major depressive disorder, single episode, unspecified: Secondary | ICD-10-CM | POA: Diagnosis not present

## 2019-09-20 DIAGNOSIS — Z880 Allergy status to penicillin: Secondary | ICD-10-CM | POA: Diagnosis not present

## 2019-09-20 DIAGNOSIS — J039 Acute tonsillitis, unspecified: Secondary | ICD-10-CM | POA: Diagnosis not present

## 2019-09-20 HISTORY — DX: Gastro-esophageal reflux disease without esophagitis: K21.9

## 2019-09-20 HISTORY — DX: Depression, unspecified: F32.A

## 2019-09-20 HISTORY — PX: TONSILLECTOMY: SHX5217

## 2019-09-20 HISTORY — DX: Unspecified convulsions: R56.9

## 2019-09-20 HISTORY — DX: Anemia, unspecified: D64.9

## 2019-09-20 LAB — POCT PREGNANCY, URINE: Preg Test, Ur: NEGATIVE

## 2019-09-20 SURGERY — TONSILLECTOMY
Anesthesia: General | Site: Throat | Laterality: Bilateral

## 2019-09-20 MED ORDER — MEPERIDINE HCL 25 MG/ML IJ SOLN: 6.2500 mg | INTRAMUSCULAR | Status: DC | PRN

## 2019-09-20 MED ORDER — SUCCINYLCHOLINE CHLORIDE 20 MG/ML IJ SOLN
INTRAMUSCULAR | Status: DC | PRN
  Administered 2019-09-20: 80 mg via INTRAVENOUS

## 2019-09-20 MED ORDER — DEXAMETHASONE SODIUM PHOSPHATE 4 MG/ML IJ SOLN
INTRAMUSCULAR | Status: DC | PRN
  Administered 2019-09-20: 10 mg via INTRAVENOUS

## 2019-09-20 MED ORDER — GLYCOPYRROLATE 0.2 MG/ML IJ SOLN
INTRAMUSCULAR | Status: DC | PRN
  Administered 2019-09-20: 0.1 mg via INTRAVENOUS

## 2019-09-20 MED ORDER — HYDROMORPHONE HCL 1 MG/ML IJ SOLN
0.2500 mg | INTRAMUSCULAR | Status: DC | PRN
  Administered 2019-09-20: 0.3 mg via INTRAVENOUS
  Administered 2019-09-20: 0.2 mg via INTRAVENOUS
  Administered 2019-09-20: 0.5 mg via INTRAVENOUS

## 2019-09-20 MED ORDER — PROPOFOL 10 MG/ML IV BOLUS
INTRAVENOUS | Status: DC | PRN
  Administered 2019-09-20: 160 mg via INTRAVENOUS

## 2019-09-20 MED ORDER — OXYCODONE HCL 5 MG PO TABS: 5.0000 mg | ORAL_TABLET | Freq: Once | ORAL | Status: AC | PRN

## 2019-09-20 MED ORDER — ONDANSETRON HCL 4 MG/2ML IJ SOLN
INTRAMUSCULAR | Status: DC | PRN
  Administered 2019-09-20: 4 mg via INTRAVENOUS

## 2019-09-20 MED ORDER — PROMETHAZINE HCL 25 MG/ML IJ SOLN
6.2500 mg | INTRAMUSCULAR | Status: DC | PRN
  Administered 2019-09-20: 6.25 mg via INTRAVENOUS

## 2019-09-20 MED ORDER — MIDAZOLAM HCL 5 MG/5ML IJ SOLN
INTRAMUSCULAR | Status: DC | PRN
  Administered 2019-09-20: 2 mg via INTRAVENOUS

## 2019-09-20 MED ORDER — HYDROCODONE-ACETAMINOPHEN 7.5-325 MG/15ML PO SOLN: 15.0000 mL | ORAL | 0 refills | Status: AC | PRN

## 2019-09-20 MED ORDER — SCOPOLAMINE 1 MG/3DAYS TD PT72
1.0000 | MEDICATED_PATCH | TRANSDERMAL | Status: DC
  Administered 2019-09-20: 1.5 mg via TRANSDERMAL

## 2019-09-20 MED ORDER — LACTATED RINGERS IV SOLN
100.0000 mL/h | INTRAVENOUS | Status: DC
  Administered 2019-09-20: 100 mL/h via INTRAVENOUS

## 2019-09-20 MED ORDER — OXYCODONE HCL 5 MG/5ML PO SOLN
5.0000 mg | Freq: Once | ORAL | Status: AC | PRN
  Administered 2019-09-20: 5 mg via ORAL

## 2019-09-20 MED ORDER — ACETAMINOPHEN 10 MG/ML IV SOLN
1000.0000 mg | Freq: Once | INTRAVENOUS | Status: AC
  Administered 2019-09-20: 1000 mg via INTRAVENOUS

## 2019-09-20 MED ORDER — FENTANYL CITRATE (PF) 100 MCG/2ML IJ SOLN
INTRAMUSCULAR | Status: DC | PRN
  Administered 2019-09-20 (×2): 25 ug via INTRAVENOUS
  Administered 2019-09-20: 50 ug via INTRAVENOUS
  Administered 2019-09-20: 25 ug via INTRAVENOUS

## 2019-09-20 MED ORDER — LIDOCAINE HCL (CARDIAC) PF 100 MG/5ML IV SOSY
PREFILLED_SYRINGE | INTRAVENOUS | Status: DC | PRN
  Administered 2019-09-20: 50 mg via INTRAVENOUS

## 2019-09-20 SURGICAL SUPPLY — 14 items
BLADE BOVIE TIP EXT 4 (BLADE) ×3 IMPLANT
CANISTER SUCT 1200ML W/VALVE (MISCELLANEOUS) ×3 IMPLANT
ELECT REM PT RETURN 9FT ADLT (ELECTROSURGICAL) ×3
ELECTRODE REM PT RTRN 9FT ADLT (ELECTROSURGICAL) ×1 IMPLANT
GLOVE PI ULTRA LF STRL 7.5 (GLOVE) ×1 IMPLANT
GLOVE PI ULTRA NON LATEX 7.5 (GLOVE) ×2
KIT TURNOVER KIT A (KITS) ×3 IMPLANT
PACK TONSIL AND ADENOID CUSTOM (PACKS) ×3 IMPLANT
PENCIL SMOKE EVACUATOR (MISCELLANEOUS) ×3 IMPLANT
SLEEVE SUCTION 125 (MISCELLANEOUS) ×3 IMPLANT
SOL ANTI-FOG 6CC FOG-OUT (MISCELLANEOUS) ×1 IMPLANT
SOL FOG-OUT ANTI-FOG 6CC (MISCELLANEOUS) ×2
SPONGE TONSIL .75 RFD DBL STRL (DISPOSABLE) ×3 IMPLANT
STRAP BODY AND KNEE 60X3 (MISCELLANEOUS) ×3 IMPLANT

## 2019-09-20 NOTE — Op Note (Signed)
09/20/2019  8:22 AM    Mercy Riding  LT:[id number]   Pre-Op Dx: Chronic tonsillitis, recent left peritonsillar abscess  Post-op Dx: Same  Proc: Tonsillectomy  Surg:  Elon Alas Pier Laux  Anes:  GOT  EBL: 100 mL  Comp: None  Findings: Extremely scarred left peritonsillar area still with some purulence in this area.  The right tonsil was scarred down as well but not as bad and no sign of active infection.  Procedure: The patient was given general anesthesia by oral endotracheal ovation.  A Shaune Pascal was used to visualize the oropharynx.  The left tonsil was sticking out into the airway more than the right tonsil.  Neither side had any exudate or redness.  When I pushed on the left tonsil there was a small area of granulation in the upper lateral soft palate that drained some pus out.  Soft palate was retracted visualize the nasopharynx and this did not show any significant adenoid tissue.  The left tonsil was grasped and pulled medially.  The anterior pillar was incised starting from the area of granulation where the pus was draining to find the peritonsillar area between the tonsil and muscle bed.  There is lots of granulation in this area and difficult to separate the tonsil from the underlying muscle.  I used electrocautery and direct pressure to try to peel the scarred inflamed tissue away from the muscle bed.  Bleeding was controlled with electrocautery.  It took a long time to free up the left tonsil from the tonsil bed.  The right tonsil was then grasped and pulled medially.  The anterior pillar was incised electrocautery and the tonsil was dissected from its fossa using blunt dissection and electrocautery.  This tonsil came out easier.  Once the tonsil was removed the bleeding was controlled with electrocautery and direct pressure.  Both sides were then visualized and there was no further bleeding noted.  The tonsillar beds appeared to be clear of tonsil tissue on both  sides.  Patient was awakened and taken to the recovery room in satisfactory condition.  There were no operative complications.  Dispo:   To PACU to be discharged home.  Plan: To follow-up in the office in about 2 weeks to make sure she is doing well.  She will push liquids at home.  We will give her Tylenol with hydrocodone to use for pain at home.  She will call if she has any problems.  Elon Alas Nicoya Friel  09/20/2019 8:22 AM

## 2019-09-20 NOTE — H&P (Signed)
H&P has been reviewed and patient reevaluated, no changes necessary. To be downloaded later.  

## 2019-09-20 NOTE — Anesthesia Procedure Notes (Signed)
Procedure Name: Intubation Performed by: Mayme Genta, CRNA Pre-anesthesia Checklist: Patient identified, Emergency Drugs available, Suction available, Patient being monitored and Timeout performed Patient Re-evaluated:Patient Re-evaluated prior to induction Oxygen Delivery Method: Circle system utilized Preoxygenation: Pre-oxygenation with 100% oxygen Induction Type: IV induction Ventilation: Mask ventilation without difficulty Laryngoscope Size: Miller and 2 Grade View: Grade I Tube type: Oral Rae Tube size: 7.0 mm Number of attempts: 1 Placement Confirmation: ETT inserted through vocal cords under direct vision,  positive ETCO2 and breath sounds checked- equal and bilateral Tube secured with: Tape Dental Injury: Teeth and Oropharynx as per pre-operative assessment

## 2019-09-20 NOTE — Anesthesia Postprocedure Evaluation (Signed)
Anesthesia Post Note  Patient: Dorothy Chen  Procedure(s) Performed: TONSILLECTOMY (Bilateral Throat)     Patient location during evaluation: PACU Anesthesia Type: General Level of consciousness: awake and alert Pain management: pain level controlled Vital Signs Assessment: post-procedure vital signs reviewed and stable Respiratory status: spontaneous breathing, nonlabored ventilation, respiratory function stable and patient connected to nasal cannula oxygen Cardiovascular status: blood pressure returned to baseline and stable Postop Assessment: no apparent nausea or vomiting Anesthetic complications: no    Dorothy Chen  Cavion Faiola

## 2019-09-20 NOTE — Transfer of Care (Signed)
Immediate Anesthesia Transfer of Care Note  Patient: Dorothy Chen  Procedure(s) Performed: TONSILLECTOMY (Bilateral Throat)  Patient Location: PACU  Anesthesia Type: General  Level of Consciousness: awake, alert  and patient cooperative  Airway and Oxygen Therapy: Patient Spontanous Breathing and Patient connected to supplemental oxygen  Post-op Assessment: Post-op Vital signs reviewed, Patient's Cardiovascular Status Stable, Respiratory Function Stable, Patent Airway and No signs of Nausea or vomiting  Post-op Vital Signs: Reviewed and stable  Complications: No apparent anesthesia complications

## 2019-09-21 ENCOUNTER — Encounter: Payer: Self-pay | Admitting: Otolaryngology

## 2019-09-24 LAB — SURGICAL PATHOLOGY

## 2020-03-04 ENCOUNTER — Emergency Department
Admission: EM | Admit: 2020-03-04 | Discharge: 2020-03-04 | Disposition: A | Discharge status: Home / Self Care | Payer: Medicaid Other | Attending: Emergency Medicine | Admitting: Emergency Medicine

## 2020-03-04 ENCOUNTER — Other Ambulatory Visit: Payer: Self-pay

## 2020-03-04 ENCOUNTER — Emergency Department: Payer: Medicaid Other

## 2020-03-04 DIAGNOSIS — J45909 Unspecified asthma, uncomplicated: Secondary | ICD-10-CM | POA: Insufficient documentation

## 2020-03-04 DIAGNOSIS — R079 Chest pain, unspecified: Secondary | ICD-10-CM | POA: Diagnosis not present

## 2020-03-04 DIAGNOSIS — F17210 Nicotine dependence, cigarettes, uncomplicated: Secondary | ICD-10-CM | POA: Insufficient documentation

## 2020-03-04 LAB — BASIC METABOLIC PANEL
Anion gap: 3 — ABNORMAL LOW (ref 5–15)
BUN: 12 mg/dL (ref 6–20)
CO2: 25 mmol/L (ref 22–32)
Calcium: 8.7 mg/dL — ABNORMAL LOW (ref 8.9–10.3)
Chloride: 109 mmol/L (ref 98–111)
Creatinine, Ser: 0.67 mg/dL (ref 0.44–1.00)
GFR calc Af Amer: >60 mL/min (ref >60)
GFR calc non Af Amer: >60 mL/min (ref >60)
Glucose, Bld: 87 mg/dL (ref 70–99)
Potassium: 4.4 mmol/L (ref 3.5–5.1)
Sodium: 137 mmol/L (ref 135–145)

## 2020-03-04 LAB — TROPONIN I (HIGH SENSITIVITY): Troponin I (High Sensitivity): <2 ng/L (ref <18)

## 2020-03-04 LAB — CBC
HCT: 35.2 % — ABNORMAL LOW (ref 36.0–46.0)
Hemoglobin: 11.1 g/dL — ABNORMAL LOW (ref 12.0–15.0)
MCH: 22.3 pg — ABNORMAL LOW (ref 26.0–34.0)
MCHC: 31.5 g/dL (ref 30.0–36.0)
MCV: 70.8 fL — ABNORMAL LOW (ref 80.0–100.0)
Platelets: 279 10*3/uL (ref 150–400)
RBC: 4.97 MIL/uL (ref 3.87–5.11)
RDW: 20.2 % — ABNORMAL HIGH (ref 11.5–15.5)
WBC: 6.5 10*3/uL (ref 4.0–10.5)
nRBC: 0 % (ref 0.0–0.2)

## 2020-03-04 NOTE — ED Triage Notes (Addendum)
Pt c/o central CP since this morning, states hurts to move, breath, and talk. States pain goes to back. A&O, ambulatory. No distress noted but pt crying in triage.

## 2020-03-04 NOTE — ED Provider Notes (Signed)
[facility] Emergency Department Provider Note  Time seen: 2:58 PM  I have reviewed the triage vital signs and the nursing notes.   HISTORY  Chief Complaint Chest Pain   HPI Dorothy Chen is a 34 y.o. female with a past medical history of anemia, asthma, gastric reflux, presents to the emergency department for left chest pain.  According to the patient since 6 AM this morning she has been experiencing left chest pain, worse with chest movement.  Denies any shortness of breath cough or fever.  No leg pain or swelling.  Describes the pain is mild currently but moderate if she moves.   Past Medical History:  Diagnosis Date  . Anemia   . Asthma   . Depression   . GERD (gastroesophageal reflux disease)   . Seizure ([place])    Pt reports she would have seizures when she got overheated until about 15 yrs ago.  None since. Never any meds.(09/2019)    There are no problems to display for this patient.   Past Surgical History:  Procedure Laterality Date  . CESAREAN SECTION  2009,2011,2014  . CHOLECYSTECTOMY    . TONSILLECTOMY Bilateral 09/20/2019   Procedure: TONSILLECTOMY;  Surgeon: Margaretha Sheffield, MD;  Location: [place];  Service: ENT;  Laterality: Bilateral;    Prior to Admission medications   Medication Sig Start Date End Date Taking? Authorizing Provider  albuterol (VENTOLIN HFA) 108 (90 Base) MCG/ACT inhaler Inhale into the lungs every 6 (six) hours as needed for wheezing or shortness of breath.    [provider]  FLUoxetine (PROZAC) 20 MG capsule Take 20 mg by mouth daily.    [provider]  predniSONE (DELTASONE) 10 MG tablet Take 1 tablet (10 mg total) by mouth daily. Day 1-3: take 4 tablets PO daily Day 4-6: take 3 tablets PO daily Day 7-9: take 2 tablets PO daily Day 10-12: take 1 tablet PO daily Patient not taking: Reported on 09/20/2019 09/05/19   Harvest Dark, MD    Allergies  Allergen Reactions  .  Amoxicillin Anaphylaxis  . Penicillins Anaphylaxis    Has patient had a PCN reaction causing immediate rash, facial/tongue/throat swelling, SOB or lightheadedness with hypotension: Yes Has patient had a PCN reaction causing severe rash involving mucus membranes or skin necrosis: Yes Has patient had a PCN reaction that required hospitalization Yes Has patient had a PCN reaction occurring within the last 10 years: No If all of the above answers are "NO", then may proceed with Cephalosporin use.     History reviewed. No pertinent family history.  Social History Social History   Tobacco Use  . Smoking status: Current Every Day Smoker    Packs/day: 0.50    Years: 17.00    Pack years: 8.50  . Smokeless tobacco: Never Used  . Tobacco comment: since age 57  Substance Use Topics  . Alcohol use: Yes    Alcohol/week: 1.0 standard drinks    Types: 1 Shots of liquor per week  . Drug use: No    Review of Systems Constitutional: Negative for fever. Cardiovascular: Left chest pain. Respiratory: Negative for shortness of breath. Gastrointestinal: Negative for abdominal pain Genitourinary: Negative for urinary compaints Musculoskeletal: Negative for musculoskeletal complaints Neurological: Negative for headache All other ROS negative  ____________________________________________   PHYSICAL EXAM:  VITAL SIGNS: ED Triage Vitals  Enc Vitals Group     BP 03/04/20 1404 (!) 136/91     Pulse Rate 03/04/20 1404 84  Resp 03/04/20 1404 18     Temp 03/04/20 1404 98.4 F (36.9 C)     Temp Source 03/04/20 1404 Oral     SpO2 03/04/20 1404 100 %     Weight 03/04/20 1403 210 lb (95.3 kg)     Height 03/04/20 1403 5\' 1"  (1.549 m)     Head Circumference --      Peak Flow --      Pain Score 03/04/20 1403 9     Pain Loc --      Pain Edu? --      Excl. in [place]? --     Constitutional: Alert and oriented. Well appearing and in no distress. Eyes: Normal exam ENT      Head: Normocephalic and  atraumatic.      Mouth/Throat: Mucous membranes are moist. Cardiovascular: Normal rate, regular rhythm. No murmur Respiratory: Normal respiratory effort without tachypnea nor retractions. Breath sounds are clear.  Moderate chest wall tenderness to palpation. Gastrointestinal: Soft and nontender. No distention.   Musculoskeletal: Nontender with normal range of motion in all extremities. Neurologic:  Normal speech and language. No gross focal neurologic deficits Skin:  Skin is warm, dry and intact.  Psychiatric: Mood and affect are normal.   ____________________________________________    EKG  EKG viewed and interpreted by myself shows a normal sinus rhythm at 74 bpm with a narrow QRS, normal axis, normal intervals, no concerning ST changes.  ____________________________________________    RADIOLOGY  Chest x-ray is negative.  ____________________________________________   INITIAL IMPRESSION / ASSESSMENT AND PLAN / ED COURSE  Pertinent labs & imaging results that were available during my care of the patient were reviewed by me and considered in my medical decision making (see chart for details).   Patient presents to the emergency department for chest pain.  Pain is worse with movement.  Pain is reproducible on exam.  Patient's lab work is normal including negative troponin.  Reassuring EKG and chest x-ray.  Patient is PERC negative, does not take birth control or estrogen supplements.  We will discharge the patient home with supportive care at home Tylenol ibuprofen as needed for likely chest wall strain.  Dorothy Chen was evaluated in Emergency Department on 03/04/2020 for the symptoms described in the history of present illness. She was evaluated in the context of the global COVID-19 pandemic, which necessitated consideration that the patient might be at risk for infection with the SARS-CoV-2 virus that causes COVID-19. Institutional protocols and algorithms that pertain to the  evaluation of patients at risk for COVID-19 are in a state of rapid change based on information released by regulatory bodies including the CDC and federal and state organizations. These policies and algorithms were followed during the patient's care in the ED.  ____________________________________________   FINAL CLINICAL IMPRESSION(S) / ED DIAGNOSES  Left chest pain   Harvest Dark, MD 03/04/20 2131

## 2020-07-23 ENCOUNTER — Emergency Department
Admission: EM | Admit: 2020-07-23 | Discharge: 2020-07-23 | Disposition: A | Discharge status: Home / Self Care | Payer: Medicaid Other | Attending: Student in an Organized Health Care Education/Training Program | Admitting: Student in an Organized Health Care Education/Training Program

## 2020-07-23 ENCOUNTER — Other Ambulatory Visit: Payer: Self-pay

## 2020-07-23 ENCOUNTER — Encounter: Payer: Self-pay | Admitting: Emergency Medicine

## 2020-07-23 DIAGNOSIS — B9689 Other specified bacterial agents as the cause of diseases classified elsewhere: Secondary | ICD-10-CM | POA: Diagnosis not present

## 2020-07-23 DIAGNOSIS — F17210 Nicotine dependence, cigarettes, uncomplicated: Secondary | ICD-10-CM | POA: Insufficient documentation

## 2020-07-23 DIAGNOSIS — L732 Hidradenitis suppurativa: Secondary | ICD-10-CM | POA: Diagnosis not present

## 2020-07-23 DIAGNOSIS — J45909 Unspecified asthma, uncomplicated: Secondary | ICD-10-CM | POA: Diagnosis not present

## 2020-07-23 DIAGNOSIS — L02412 Cutaneous abscess of left axilla: Secondary | ICD-10-CM | POA: Diagnosis present

## 2020-07-23 MED ORDER — CLINDAMYCIN PHOSPHATE 600 MG/4ML IJ SOLN
600.0000 mg | Freq: Once | INTRAMUSCULAR | Status: DC
  Filled 2020-07-23: qty 4

## 2020-07-23 MED ORDER — ACETAMINOPHEN 325 MG PO TABS
650.0000 mg | ORAL_TABLET | Freq: Once | ORAL | Status: AC
  Administered 2020-07-23: 650 mg via ORAL
  Filled 2020-07-23: qty 2

## 2020-07-23 MED ORDER — LIDOCAINE HCL (PF) 1 % IJ SOLN
5.0000 mL | Freq: Once | INTRAMUSCULAR | Status: AC
  Administered 2020-07-23: 5 mL via INTRADERMAL
  Filled 2020-07-23: qty 5

## 2020-07-23 MED ORDER — CLINDAMYCIN PHOSPHATE 600 MG/50ML IV SOLN: 600.0000 mg | Freq: Once | INTRAVENOUS | Status: DC

## 2020-07-23 MED ORDER — LIDOCAINE-PRILOCAINE 2.5-2.5 % EX CREA
TOPICAL_CREAM | Freq: Once | CUTANEOUS | Status: AC
  Filled 2020-07-23: qty 5

## 2020-07-23 MED ORDER — CLINDAMYCIN PHOSPHATE 900 MG/6ML IJ SOLN
600.0000 mg | Freq: Once | INTRAMUSCULAR | Status: AC
  Administered 2020-07-23: 600 mg via INTRAMUSCULAR
  Filled 2020-07-23: qty 4

## 2020-07-23 MED ORDER — CLINDAMYCIN HCL 300 MG PO CAPS: 300.0000 mg | ORAL_CAPSULE | Freq: Three times a day (TID) | ORAL | 0 refills | Status: AC

## 2020-07-23 MED ORDER — LORAZEPAM 1 MG PO TABS
1.0000 mg | ORAL_TABLET | Freq: Once | ORAL | Status: AC
  Administered 2020-07-23: 1 mg via ORAL
  Filled 2020-07-23: qty 1

## 2020-07-23 NOTE — ED Notes (Addendum)
Pt left prior to being discharged. Provider aware pt did not receive antibiotic

## 2020-07-23 NOTE — ED Triage Notes (Signed)
Pt in via POV, reports "boil" to left axilla x a few months, pain and discharge worsening over the last two weeks.  Ambulatory to triage, NAD noted at this time.

## 2020-07-23 NOTE — ED Provider Notes (Addendum)
[facility] Emergency Department Provider Note  ____________________________________________  Time seen: Approximately 2:33 PM  I have reviewed the triage vital signs and the nursing notes.   HISTORY  Chief Complaint Abscess    HPI Dorothy Chen is a 34 y.o. female that presents to the emergency department for evaluation of acute on chronic abscess to left axilla.  Patient states that areas to bilateral axilla fill up with fluid every night and she is then able to express drainage.  This has been going on for several months.  Area to the left axilla has been slowly worsening over the last 3 weeks.  Over the last couple of days, she has noticed a foul odor.  She is still expressing purulent drainage.  Area seems to continue to grow though.  No fevers.   Past Medical History:  Diagnosis Date  . Anemia   . Asthma   . Depression   . GERD (gastroesophageal reflux disease)   . Seizure ([place])    Pt reports she would have seizures when she got overheated until about 15 yrs ago.  None since. Never any meds.(09/2019)    There are no problems to display for this patient.   Past Surgical History:  Procedure Laterality Date  . CESAREAN SECTION  2009,2011,2014  . CHOLECYSTECTOMY    . TONSILLECTOMY Bilateral 09/20/2019   Procedure: TONSILLECTOMY;  Surgeon: Margaretha Sheffield, MD;  Location: [place];  Service: ENT;  Laterality: Bilateral;    Prior to Admission medications   Medication Sig Start Date End Date Taking? Authorizing Provider  albuterol (VENTOLIN HFA) 108 (90 Base) MCG/ACT inhaler Inhale into the lungs every 6 (six) hours as needed for wheezing or shortness of breath.    [provider]  clindamycin (CLEOCIN) 300 MG capsule Take 1 capsule (300 mg total) by mouth 3 (three) times daily for 10 days. 07/23/20 08/02/20  Laban Emperor, PA-C  FLUoxetine (PROZAC) 20 MG capsule Take 20 mg by mouth daily.    [provider]     Allergies Amoxicillin and Penicillins  No family history on file.  Social History Social History   Tobacco Use  . Smoking status: Current Every Day Smoker    Packs/day: 0.50    Years: 17.00    Pack years: 8.50    Types: Cigarettes  . Smokeless tobacco: Never Used  . Tobacco comment: since age 67  Vaping Use  . Vaping Use: Never used  Substance Use Topics  . Alcohol use: Yes    Alcohol/week: 1.0 standard drink    Types: 1 Shots of liquor per week  . Drug use: No     Review of Systems  Constitutional: No fever/chills Cardiovascular: No chest pain. Respiratory: No SOB. Gastrointestinal:  Nno vomiting.  Musculoskeletal: Negative for musculoskeletal pain. Skin: Negative for abrasions, lacerations, ecchymosis.  Positive for rash.  ____________________________________________   PHYSICAL EXAM:  VITAL SIGNS: ED Triage Vitals  Enc Vitals Group     BP 07/23/20 1129 135/72     Pulse Rate 07/23/20 1129 74     Resp 07/23/20 1129 16     Temp 07/23/20 1129 99 F (37.2 C)     Temp Source 07/23/20 1129 Oral     SpO2 07/23/20 1129 98 %     Weight 07/23/20 1123 205 lb (93 kg)     Height 07/23/20 1123 5\' 1"  (1.549 m)     Head Circumference --      Peak Flow --  Pain Score 07/23/20 1122 8     Pain Loc --      Pain Edu? --      Excl. in [place]? --      Constitutional: Alert and oriented. Well appearing and in no acute distress. Eyes: Conjunctivae are normal. PERRL. EOMI. Head: Atraumatic. ENT:      Ears:      Nose: No congestion/rhinnorhea.      Mouth/Throat: Mucous membranes are moist.  Neck: No stridor. Cardiovascular: Normal rate, regular rhythm.  Good peripheral circulation. Respiratory: Normal respiratory effort without tachypnea or retractions. Lungs CTAB. Good air entry to the bases with no decreased or absent breath sounds. Musculoskeletal: Full range of motion to all extremities. No gross deformities appreciated. Neurologic:  Normal speech and language.  No gross focal neurologic deficits are appreciated.  Skin:  Skin is warm, dry and intact.  Multiple areas of induration and fluctuance to left axilla with surrounding swelling.  Purulent drainage expressed from the center. Psychiatric: Mood and affect are normal. Speech and behavior are normal. Patient exhibits appropriate insight and judgement.   ____________________________________________   LABS (all labs ordered are listed, but only abnormal results are displayed)  Labs Reviewed - No data to display ____________________________________________  EKG   ____________________________________________  RADIOLOGY  No results found.  ____________________________________________    PROCEDURES  Procedure(s) performed:    Procedures    Medications  clindamycin (CLEOCIN) injection 600 mg (has no administration in time range)  lidocaine-prilocaine (EMLA) cream ( Topical Given 07/23/20 1251)  lidocaine (PF) (XYLOCAINE) 1 % injection 5 mL (5 mLs Intradermal Given 07/23/20 1251)  LORazepam (ATIVAN) tablet 1 mg (1 mg Oral Given 07/23/20 1250)  acetaminophen (TYLENOL) tablet 650 mg (650 mg Oral Given 07/23/20 1250)     ____________________________________________   INITIAL IMPRESSION / ASSESSMENT AND PLAN / ED COURSE  Pertinent labs & imaging results that were available during my care of the patient were reviewed by me and considered in my medical decision making (see chart for details).  Review of the [place] CSRS was performed in accordance of the [place] prior to dispensing any controlled drugs.   Patient's diagnosis is consistent with hidradenitis.  Vital signs and exam are reassuring.  Patient has several areas of induration and swelling to the left axilla.  Area is actively draining.  However we discussed that I feel that patient would benefit from further incision and drainage.  Patient is agreeable.  Plan was to attempt incision and drainage, but just prior to incision and  drainage, patient states that she would like to try a course of oral antibiotics with follow-up with general surgery since abscess has been actively draining.  IM clindamycin ordered.  Patient eloped from the emergency department prior to receiving IM medication or receiving discharge paperwork and prescription.  Patient then returned to receive medication and discharge paperwork.  Strict return precautions were given.  She will follow-up with general surgery.  She will return to the emergency department with any worsening or concerning symptoms.  Dorothy Chen was evaluated in Emergency Department on 07/23/2020 for the symptoms described in the history of present illness. She was evaluated in the context of the global COVID-19 pandemic, which necessitated consideration that the patient might be at risk for infection with the SARS-CoV-2 virus that causes COVID-19. Institutional protocols and algorithms that pertain to the evaluation of patients at risk for COVID-19 are in a state of rapid change based on information released by regulatory bodies including the CDC and  federal and state organizations. These policies and algorithms were followed during the patient's care in the ED.   ____________________________________________  FINAL CLINICAL IMPRESSION(S) / ED DIAGNOSES  Final diagnoses:  Hidradenitis      NEW MEDICATIONS STARTED DURING THIS VISIT:  ED Discharge Orders         Ordered    clindamycin (CLEOCIN) 300 MG capsule  3 times daily        07/23/20 1458              This chart was dictated using voice recognition software/Dragon. Despite best efforts to proofread, errors can occur which can change the meaning. Any change was purely unintentional.    Laban Emperor, PA-C 07/24/20 0717    Laban Emperor, PA-C 07/24/20 0719    Merlyn Lot, MD 07/24/20 [phone]

## 2020-07-23 NOTE — ED Provider Notes (Signed)
-----------------------------------------   5:08 PM on 07/23/2020 -----------------------------------------  Blood pressure 127/79, pulse 75, temperature 99 F (37.2 C), temperature source Oral, resp. rate 18, height 5\' 1"  (1.549 m), weight 93 kg, SpO2 94 %.  Assuming care from Dr. Laban Emperor, PA-C.  In short, Dorothy Chen is a 34 y.o. female with a chief complaint of Abscess .  Refer to the original H&P for additional details.  The current plan of care is to patient had to leave the ED prior to IM medication administration, to pick up her young child. I phoned the patient and left a message with her mother, to have the patient return to the ED at her earliest convenience.   She returns to the ED as directed. She will have her medication administered. She will be discharged to follow-up as discussed.     Melvenia Needles, PA-C 07/23/20 1712    Nance Pear, MD 07/23/20 [phone]

## 2020-07-23 NOTE — ED Notes (Signed)
See triage note  Presents with possible abscess area under left arm  States she noticed the area about 3 months ago  Has had some drainage from area

## 2020-07-23 NOTE — ED Notes (Signed)
PA called pt to come back, pt came back and discharge undone. Will admin medications

## 2020-08-04 ENCOUNTER — Ambulatory Visit: Payer: Self-pay | Admitting: Surgery

## 2020-08-04 MED ORDER — GENTAMICIN SULFATE 40 MG/ML IJ SOLN
5.0000 mg/kg | INTRAVENOUS | Status: AC
  Filled 2020-08-04: qty 8.25

## 2020-08-04 MED ORDER — CLINDAMYCIN PHOSPHATE 900 MG/50ML IV SOLN
900.0000 mg | INTRAVENOUS | Status: AC
  Filled 2020-08-04: qty 50

## 2020-08-04 NOTE — H&P (Signed)
Subjective:   CC: Hidradenitis [L73.2]  HPI:  Dorothy Chen is a 34 y.o. female who is here for evaluation of above. First noted several months ago.  Symptoms include: Pain is sharp, constant.  Exacerbated by nothing specific.  Alleviated by nothing specific despite antibiotics.  Associated with drainage.     Past Medical History:  has a past medical history of Anemia and Asthma.  Past Surgical History:  has a past surgical history that includes Cholecystectomy.  Family History: family history includes Breast cancer in her father; Diabetes in her mother; High blood pressure (Hypertension) in her brother, father, and mother.  Social History:  reports that she has been smoking cigarettes. She has been smoking about 0.50 packs per day. She does not have any smokeless tobacco history on file. No history on file for alcohol use and drug use.  Current Medications: has a current medication list which includes the following prescription(s): albuterol, clindamycin, dicyclomine, docusate, ferrous gluconate, fluoxetine, omeprazole, and prednisone.  Allergies:      Allergies  Allergen Reactions  . Amoxicillin Hives  . Penicillin V Unknown    ROS:  A 15 point review of systems was performed and pertinent positives and negatives noted in HPI   Objective:   BP (!) 129/90   Pulse 70   Ht 160 cm (5\' 3" )   Wt 95.4 kg (210 lb 6.4 oz)   BMI 37.27 kg/m   Constitutional :  alert, appears stated age, cooperative and no distress  Lymphatics/Throat:  no asymmetry, masses, or scars  Respiratory:  clear to auscultation bilaterally  Cardiovascular:  regular rate and rhythm  Gastrointestinal: soft, non-tender; bowel sounds normal; no masses,  no organomegaly.    Musculoskeletal: Steady gait and movement  Skin: Cool and moist,   Psychiatric: Normal affect, non-agitated, not confused       LABS:  n/a   RADS: n/a  Assessment:      Hidradenitis [L73.2], active abscess  today with persistent symptoms of pain and discharge.  Plan:   1. Hidradenitis [L73.2] Discussed surgical I&D for symptom control until seen by multispecialty group for maintenance treatment and monitor.  Alternatives include continued observation.  Benefits include possible symptom relief. Discussed the risk of surgery including recurrence, chronic pain, post-op infxn, poor cosmesis, poor/delayed wound healing, and possible re-operation to address said risks. The risks of general anesthetic, if used, includes MI, CVA, sudden death or even reaction to anesthetic medications also discussed.  Typical post-op recovery time and need for dressing changes discussed as well.  The patient verbalized understanding and all questions were answered to the patient's satisfaction.  2. Patient has elected to proceed with surgical treatment. Procedure will be scheduled.  Written consent was obtained.

## 2020-08-04 NOTE — H&P (View-Only) (Signed)
Subjective:   CC: Hidradenitis [L73.2]  HPI:  Dorothy Chen is a 34 y.o. female who is here for evaluation of above. First noted several months ago.  Symptoms include: Pain is sharp, constant.  Exacerbated by nothing specific.  Alleviated by nothing specific despite antibiotics.  Associated with drainage.     Past Medical History:  has a past medical history of Anemia and Asthma.  Past Surgical History:  has a past surgical history that includes Cholecystectomy.  Family History: family history includes Breast cancer in her father; Diabetes in her mother; High blood pressure (Hypertension) in her brother, father, and mother.  Social History:  reports that she has been smoking cigarettes. She has been smoking about 0.50 packs per day. She does not have any smokeless tobacco history on file. No history on file for alcohol use and drug use.  Current Medications: has a current medication list which includes the following prescription(s): albuterol, clindamycin, dicyclomine, docusate, ferrous gluconate, fluoxetine, omeprazole, and prednisone.  Allergies:      Allergies  Allergen Reactions  . Amoxicillin Hives  . Penicillin V Unknown    ROS:  A 15 point review of systems was performed and pertinent positives and negatives noted in HPI   Objective:   BP (!) 129/90   Pulse 70   Ht 160 cm (5\' 3" )   Wt 95.4 kg (210 lb 6.4 oz)   BMI 37.27 kg/m   Constitutional :  alert, appears stated age, cooperative and no distress  Lymphatics/Throat:  no asymmetry, masses, or scars  Respiratory:  clear to auscultation bilaterally  Cardiovascular:  regular rate and rhythm  Gastrointestinal: soft, non-tender; bowel sounds normal; no masses,  no organomegaly.    Musculoskeletal: Steady gait and movement  Skin: Cool and moist,   Psychiatric: Normal affect, non-agitated, not confused       LABS:  n/a   RADS: n/a  Assessment:      Hidradenitis [L73.2], active abscess  today with persistent symptoms of pain and discharge.  Plan:   1. Hidradenitis [L73.2] Discussed surgical I&D for symptom control until seen by multispecialty group for maintenance treatment and monitor.  Alternatives include continued observation.  Benefits include possible symptom relief. Discussed the risk of surgery including recurrence, chronic pain, post-op infxn, poor cosmesis, poor/delayed wound healing, and possible re-operation to address said risks. The risks of general anesthetic, if used, includes MI, CVA, sudden death or even reaction to anesthetic medications also discussed.  Typical post-op recovery time and need for dressing changes discussed as well.  The patient verbalized understanding and all questions were answered to the patient's satisfaction.  2. Patient has elected to proceed with surgical treatment. Procedure will be scheduled.  Written consent was obtained.

## 2020-08-05 ENCOUNTER — Other Ambulatory Visit: Payer: Self-pay

## 2020-08-05 ENCOUNTER — Other Ambulatory Visit
Admission: RE | Admit: 2020-08-05 | Discharge: 2020-08-05 | Disposition: A | Discharge status: Home / Self Care | Payer: Medicaid Other | Source: Ambulatory Visit | Attending: Surgery | Admitting: Surgery

## 2020-08-05 DIAGNOSIS — Z01812 Encounter for preprocedural laboratory examination: Secondary | ICD-10-CM | POA: Diagnosis present

## 2020-08-05 DIAGNOSIS — Z20822 Contact with and (suspected) exposure to covid-19: Secondary | ICD-10-CM | POA: Diagnosis not present

## 2020-08-06 ENCOUNTER — Encounter: Payer: Self-pay | Admitting: Urgent Care

## 2020-08-06 ENCOUNTER — Encounter
Admission: RE | Admit: 2020-08-06 | Discharge: 2020-08-06 | Disposition: A | Discharge status: Home / Self Care | Payer: Medicaid Other | Source: Ambulatory Visit | Attending: Surgery | Admitting: Surgery

## 2020-08-06 LAB — SARS CORONAVIRUS 2 (TAT 6-24 HRS): SARS Coronavirus 2: NEGATIVE

## 2020-08-06 NOTE — Patient Instructions (Addendum)
Your procedure is scheduled on:08-07-20 Report to Day Surgery on the 2nd floor of the [facility]. To find out your arrival time, please call [phone] between 1PM - 3PM on: 08-06-20  REMEMBER: Instructions that are not followed completely may result in serious medical risk, up to and including death; or upon the discretion of your surgeon and anesthesiologist your surgery may need to be rescheduled.  Do not eat food after midnight the night before surgery.  No gum chewing, lozengers or hard candies.  You may however, drink CLEAR liquids up to 2 hours before you are scheduled to arrive for your surgery. Do not drink anything within 2 hours of your scheduled arrival time.  Clear liquids include: - water  - apple juice without pulp - gatorade (not RED) - black coffee or tea (Do NOT add milk or creamers to the coffee or tea) Do NOT drink anything that is not on this list.   TAKE THESE MEDICATIONS THE MORNING OF SURGERY WITH A SIP OF WATER: 1.  Albuterol Inhaler 2.  Clindamycin   Use Albuterol inhaler on the day of surgery and bring to the hospital.  One week prior to surgery: Stop Anti-inflammatories (NSAIDS) such as Advil, Aleve, Ibuprofen, Motrin, Naproxen, Naprosyn and Aspirin based products such as Excedrin, Goodys Powder, BC Powder. Stop ANY OVER THE COUNTER supplements until after surgery.  No Alcohol for 24 hours before or after surgery.  No Smoking including e-cigarettes for 24 hours prior to surgery.  No chewable tobacco products for at least 6 hours prior to surgery.  No nicotine patches on the day of surgery.  Do not use any "recreational" drugs for at least a week prior to your surgery.  Please be advised that the combination of cocaine and anesthesia may have negative outcomes, up to and including death. If you test positive for cocaine, your surgery will be cancelled.  On the morning of surgery brush your teeth with toothpaste and water, you may rinse your  mouth with mouthwash if you wish. Do not swallow any toothpaste or mouthwash.  Do not wear jewelry, make-up, hairpins, clips or nail polish.  Do not wear lotions, powders, or perfumes.   Do not shave 48 hours prior to surgery.   Do not bring valuables to the hospital. [facility] is not responsible for any missing/lost belongings or valuables.   Use antibacterial Soap the morning of surgery.  Notify your doctor if there is any change in your medical condition (cold, fever, infection).  Wear comfortable clothing (specific to your surgery type) to the hospital.  Plan for stool softeners for home use; pain medications have a tendency to cause constipation. You can also help prevent constipation by eating foods high in fiber such as fruits and vegetables and drinking plenty of fluids as your diet allows.  After surgery, you can help prevent lung complications by doing breathing exercises.  Take deep breaths and cough every 1-2 hours. Your doctor may order a device called an Incentive Spirometer to help you take deep breaths.  If you are being discharged the day of surgery, you will not be allowed to drive home. You will need a responsible adult (18 years or older) to drive you home and stay with you that night.   If you are taking public transportation, you will need to have a responsible adult (18 years or older) with you. Please confirm with your physician that it is acceptable to use public transportation.   Please call the Pre-admissions  Testing Dept. at [phone] if you have any questions about these instructions.  Visitation Policy:  Patients undergoing a surgery or procedure may have one family member or support person with them as long as that person is not COVID-19 positive or experiencing its symptoms.  That person may remain in the waiting area during the procedure.

## 2020-08-07 ENCOUNTER — Encounter
Admission: RE | Disposition: A | Discharge status: Home / Self Care | Payer: Self-pay | Source: Home / Self Care | Attending: Surgery

## 2020-08-07 ENCOUNTER — Ambulatory Visit
Admission: RE | Admit: 2020-08-07 | Discharge: 2020-08-07 | Disposition: A | Discharge status: Home / Self Care | Payer: Medicaid Other | Attending: Surgery | Admitting: Surgery

## 2020-08-07 ENCOUNTER — Encounter: Payer: Self-pay | Admitting: Surgery

## 2020-08-07 DIAGNOSIS — Z5309 Procedure and treatment not carried out because of other contraindication: Secondary | ICD-10-CM | POA: Insufficient documentation

## 2020-08-07 DIAGNOSIS — L02412 Cutaneous abscess of left axilla: Secondary | ICD-10-CM | POA: Diagnosis present

## 2020-08-07 LAB — URINE DRUG SCREEN, QUALITATIVE (ARMC ONLY)
Amphetamines, Ur Screen: NOT DETECTED
Barbiturates, Ur Screen: NOT DETECTED
Benzodiazepine, Ur Scrn: NOT DETECTED
Cannabinoid 50 Ng, Ur ~~LOC~~: POSITIVE — AB
Cocaine Metabolite,Ur ~~LOC~~: POSITIVE — AB
MDMA (Ecstasy)Ur Screen: NOT DETECTED
Methadone Scn, Ur: NOT DETECTED
Opiate, Ur Screen: NOT DETECTED
Phencyclidine (PCP) Ur S: NOT DETECTED
Tricyclic, Ur Screen: NOT DETECTED

## 2020-08-07 LAB — POCT PREGNANCY, URINE
Preg Test, Ur: NEGATIVE
Preg Test, Ur: NEGATIVE

## 2020-08-07 SURGERY — IRRIGATION AND DEBRIDEMENT ABSCESS
Anesthesia: General | Laterality: Left

## 2020-08-07 MED ORDER — CELECOXIB 200 MG PO CAPS: 200.0000 mg | ORAL_CAPSULE | ORAL | Status: AC

## 2020-08-07 MED ORDER — CELECOXIB 200 MG PO CAPS
ORAL_CAPSULE | ORAL | Status: AC
  Administered 2020-08-07: 200 mg via ORAL
  Filled 2020-08-07: qty 1

## 2020-08-07 MED ORDER — ORAL CARE MOUTH RINSE: 15.0000 mL | Freq: Once | OROMUCOSAL | Status: AC

## 2020-08-07 MED ORDER — CHLORHEXIDINE GLUCONATE 0.12 % MT SOLN
OROMUCOSAL | Status: AC
  Administered 2020-08-07: 15 mL via OROMUCOSAL
  Filled 2020-08-07: qty 15

## 2020-08-07 MED ORDER — FAMOTIDINE 20 MG PO TABS
ORAL_TABLET | ORAL | Status: AC
  Administered 2020-08-07: 20 mg via ORAL
  Filled 2020-08-07: qty 1

## 2020-08-07 MED ORDER — GABAPENTIN 300 MG PO CAPS: 300.0000 mg | ORAL_CAPSULE | ORAL | Status: AC

## 2020-08-07 MED ORDER — CHLORHEXIDINE GLUCONATE 0.12 % MT SOLN: 15.0000 mL | Freq: Once | OROMUCOSAL | Status: AC

## 2020-08-07 MED ORDER — GABAPENTIN 300 MG PO CAPS
ORAL_CAPSULE | ORAL | Status: AC
  Administered 2020-08-07: 300 mg via ORAL
  Filled 2020-08-07: qty 1

## 2020-08-07 MED ORDER — ACETAMINOPHEN 500 MG PO TABS
ORAL_TABLET | ORAL | Status: AC
  Administered 2020-08-07: 1000 mg via ORAL
  Filled 2020-08-07: qty 2

## 2020-08-07 MED ORDER — SEVOFLURANE IN SOLN
RESPIRATORY_TRACT | Status: AC
  Filled 2020-08-07: qty 250

## 2020-08-07 MED ORDER — PHENYLEPHRINE HCL (PRESSORS) 10 MG/ML IV SOLN
INTRAVENOUS | Status: AC
  Filled 2020-08-07: qty 1

## 2020-08-07 MED ORDER — ACETAMINOPHEN 500 MG PO TABS: 1000.0000 mg | ORAL_TABLET | ORAL | Status: AC

## 2020-08-07 MED ORDER — LACTATED RINGERS IV SOLN: INTRAVENOUS | Status: DC

## 2020-08-07 MED ORDER — FAMOTIDINE 20 MG PO TABS: 20.0000 mg | ORAL_TABLET | Freq: Once | ORAL | Status: AC

## 2020-08-07 MED ORDER — CHLORHEXIDINE GLUCONATE CLOTH 2 % EX PADS: 6.0000 | MEDICATED_PAD | Freq: Once | CUTANEOUS | Status: DC

## 2020-08-07 SURGICAL SUPPLY — 27 items
BLADE SURG 15 STRL LF DISP TIS (BLADE) ×1 IMPLANT
BLADE SURG 15 STRL SS (BLADE) ×2
CANISTER SUCT 1200ML W/VALVE (MISCELLANEOUS) ×3 IMPLANT
CHLORAPREP W/TINT 26 (MISCELLANEOUS) ×3 IMPLANT
COVER WAND RF STERILE (DRAPES) ×3 IMPLANT
DRAPE LAPAROTOMY 100X77 ABD (DRAPES) ×3 IMPLANT
ELECT REM PT RETURN 9FT ADLT (ELECTROSURGICAL) ×3
ELECTRODE REM PT RTRN 9FT ADLT (ELECTROSURGICAL) ×1 IMPLANT
GAUZE PACKING IODOFORM 1/2 (PACKING) IMPLANT
GAUZE SPONGE 4X4 12PLY STRL (GAUZE/BANDAGES/DRESSINGS) ×3 IMPLANT
GLOVE BIOGEL PI IND STRL 7.0 (GLOVE) ×1 IMPLANT
GLOVE BIOGEL PI INDICATOR 7.0 (GLOVE) ×2
GLOVE SURG SYN 6.5 ES PF (GLOVE) ×6 IMPLANT
GOWN STRL REUS W/ TWL LRG LVL3 (GOWN DISPOSABLE) ×2 IMPLANT
GOWN STRL REUS W/TWL LRG LVL3 (GOWN DISPOSABLE) ×4
LABEL OR SOLS (LABEL) ×3 IMPLANT
NEEDLE HYPO 22GX1.5 SAFETY (NEEDLE) ×3 IMPLANT
NS IRRIG 500ML POUR BTL (IV SOLUTION) ×3 IMPLANT
PACK BASIN MINOR (MISCELLANEOUS) ×3 IMPLANT
SUT MNCRL 4-0 (SUTURE) ×2
SUT MNCRL 4-0 27XMFL (SUTURE) ×1
SUT VIC AB 2-0 CT2 27 (SUTURE) ×3 IMPLANT
SUT VIC AB 3-0 SH 27 (SUTURE) ×2
SUT VIC AB 3-0 SH 27X BRD (SUTURE) ×1 IMPLANT
SUTURE MNCRL 4-0 27XMF (SUTURE) ×1 IMPLANT
SYR 10ML LL (SYRINGE) ×3 IMPLANT
TOWEL OR 17X26 4PK STRL BLUE (TOWEL DISPOSABLE) ×3 IMPLANT

## 2020-08-07 NOTE — Progress Notes (Signed)
positve UDS , dr Lavone Neri aware, Dr Lysle Pearl aware.  Case cancelled.

## 2020-08-07 NOTE — Anesthesia Preprocedure Evaluation (Deleted)
Anesthesia Evaluation  Patient identified by MRN, date of birth, ID band Patient awake    Reviewed: Allergy & Precautions, H&P , NPO status , reviewed documented beta blocker date and time   Airway Mallampati: II  TM Distance: >3 FB Neck ROM: full    Dental  (+) Caps   Pulmonary asthma , Current Smoker and Patient abstained from smoking.,    Pulmonary exam normal        Cardiovascular Normal cardiovascular exam     Neuro/Psych Seizures -,  PSYCHIATRIC DISORDERS Depression    GI/Hepatic GERD  Controlled,  Endo/Other    Renal/GU      Musculoskeletal   Abdominal   Peds  Hematology  (+) Blood dyscrasia, anemia ,   Anesthesia Other Findings Past Medical History: No date: Anemia No date: Asthma No date: Depression No date: GERD (gastroesophageal reflux disease) No date: Seizure ([place])     Comment:  Pt reports she would have seizures when she got               overheated until about 15 yrs ago.  None since. Never any              meds.(09/2019) Past Surgical History: 2009,2011,2014: CESAREAN SECTION 2018: CHOLECYSTECTOMY 09/20/2019: TONSILLECTOMY; Bilateral     Comment:  Procedure: TONSILLECTOMY;  Surgeon: Margaretha Sheffield, MD;                Location: [facility];  Service: ENT;                Laterality: Bilateral; No date: TONSILLECTOMY   Reproductive/Obstetrics                             Anesthesia Physical Anesthesia Plan  ASA: II  Anesthesia Plan: General   Post-op Pain Management:    Induction: Intravenous  PONV Risk Score and Plan: Ondansetron and Treatment may vary due to age or medical condition  Airway Management Planned: Oral ETT  Additional Equipment:   Intra-op Plan:   Post-operative Plan: Extubation in OR  Informed Consent: I have reviewed the patients History and Physical, chart, labs and discussed the procedure including the risks, benefits and  alternatives for the proposed anesthesia with the patient or authorized representative who has indicated his/her understanding and acceptance.     Dental Advisory Given  Plan Discussed with: CRNA  Anesthesia Plan Comments:         Anesthesia Quick Evaluation

## 2020-08-07 NOTE — H&P (Signed)
Cocaine positive.  Will reschedule

## 2020-08-08 ENCOUNTER — Ambulatory Visit: Payer: Self-pay | Admitting: Surgery

## 2020-08-12 ENCOUNTER — Other Ambulatory Visit: Payer: Self-pay

## 2020-08-12 ENCOUNTER — Other Ambulatory Visit
Admission: RE | Admit: 2020-08-12 | Discharge: 2020-08-12 | Disposition: A | Discharge status: Home / Self Care | Payer: Medicaid Other | Source: Ambulatory Visit | Attending: Surgery | Admitting: Surgery

## 2020-08-12 DIAGNOSIS — Z20822 Contact with and (suspected) exposure to covid-19: Secondary | ICD-10-CM | POA: Diagnosis not present

## 2020-08-12 DIAGNOSIS — Z01812 Encounter for preprocedural laboratory examination: Secondary | ICD-10-CM | POA: Insufficient documentation

## 2020-08-12 NOTE — Pre-Procedure Instructions (Signed)
Attempted to contact patient via phone to remind her to come for her covid testing today.  Generic voicemail so unable to leave a message.

## 2020-08-13 LAB — SARS CORONAVIRUS 2 (TAT 6-24 HRS): SARS Coronavirus 2: NEGATIVE

## 2020-08-14 ENCOUNTER — Ambulatory Visit: Payer: Medicaid Other | Admitting: Registered Nurse

## 2020-08-14 ENCOUNTER — Encounter
Admission: RE | Disposition: A | Discharge status: Home / Self Care | Payer: Self-pay | Source: Home / Self Care | Attending: Surgery

## 2020-08-14 ENCOUNTER — Encounter: Payer: Self-pay | Admitting: Surgery

## 2020-08-14 ENCOUNTER — Other Ambulatory Visit: Payer: Self-pay

## 2020-08-14 ENCOUNTER — Ambulatory Visit
Admission: RE | Admit: 2020-08-14 | Discharge: 2020-08-14 | Disposition: A | Discharge status: Home / Self Care | Payer: Medicaid Other | Attending: Surgery | Admitting: Surgery

## 2020-08-14 DIAGNOSIS — L732 Hidradenitis suppurativa: Secondary | ICD-10-CM | POA: Diagnosis not present

## 2020-08-14 DIAGNOSIS — F17210 Nicotine dependence, cigarettes, uncomplicated: Secondary | ICD-10-CM | POA: Diagnosis not present

## 2020-08-14 DIAGNOSIS — Z803 Family history of malignant neoplasm of breast: Secondary | ICD-10-CM | POA: Insufficient documentation

## 2020-08-14 DIAGNOSIS — Z79899 Other long term (current) drug therapy: Secondary | ICD-10-CM | POA: Insufficient documentation

## 2020-08-14 DIAGNOSIS — K219 Gastro-esophageal reflux disease without esophagitis: Secondary | ICD-10-CM | POA: Diagnosis not present

## 2020-08-14 DIAGNOSIS — D649 Anemia, unspecified: Secondary | ICD-10-CM | POA: Diagnosis not present

## 2020-08-14 DIAGNOSIS — Z880 Allergy status to penicillin: Secondary | ICD-10-CM | POA: Insufficient documentation

## 2020-08-14 DIAGNOSIS — J45909 Unspecified asthma, uncomplicated: Secondary | ICD-10-CM | POA: Diagnosis not present

## 2020-08-14 DIAGNOSIS — F32A Depression, unspecified: Secondary | ICD-10-CM | POA: Diagnosis not present

## 2020-08-14 DIAGNOSIS — L02412 Cutaneous abscess of left axilla: Secondary | ICD-10-CM | POA: Diagnosis present

## 2020-08-14 HISTORY — PX: INCISION AND DRAINAGE ABSCESS: SHX5864

## 2020-08-14 LAB — URINE DRUG SCREEN, QUALITATIVE (ARMC ONLY)
Amphetamines, Ur Screen: NOT DETECTED
Barbiturates, Ur Screen: NOT DETECTED
Benzodiazepine, Ur Scrn: NOT DETECTED
Cannabinoid 50 Ng, Ur ~~LOC~~: POSITIVE — AB
Cocaine Metabolite,Ur ~~LOC~~: NOT DETECTED
MDMA (Ecstasy)Ur Screen: NOT DETECTED
Methadone Scn, Ur: NOT DETECTED
Opiate, Ur Screen: NOT DETECTED
Phencyclidine (PCP) Ur S: NOT DETECTED
Tricyclic, Ur Screen: NOT DETECTED

## 2020-08-14 LAB — POCT PREGNANCY, URINE: Preg Test, Ur: NEGATIVE

## 2020-08-14 SURGERY — INCISION AND DRAINAGE, ABSCESS
Anesthesia: General | Laterality: Left

## 2020-08-14 MED ORDER — ONDANSETRON HCL 4 MG/2ML IJ SOLN: 4.0000 mg | Freq: Once | INTRAMUSCULAR | Status: DC | PRN

## 2020-08-14 MED ORDER — LIDOCAINE HCL 1 % IJ SOLN
INTRAMUSCULAR | Status: DC | PRN
  Administered 2020-08-14: 1.5 mL

## 2020-08-14 MED ORDER — GABAPENTIN 300 MG PO CAPS
ORAL_CAPSULE | ORAL | Status: AC
  Administered 2020-08-14: 300 mg via ORAL
  Filled 2020-08-14: qty 1

## 2020-08-14 MED ORDER — LACTATED RINGERS IV SOLN: INTRAVENOUS | Status: DC

## 2020-08-14 MED ORDER — DEXAMETHASONE SODIUM PHOSPHATE 10 MG/ML IJ SOLN
INTRAMUSCULAR | Status: DC | PRN
  Administered 2020-08-14: 10 mg via INTRAVENOUS

## 2020-08-14 MED ORDER — HYDROCODONE-ACETAMINOPHEN 5-325 MG PO TABS
1.0000 | ORAL_TABLET | Freq: Four times a day (QID) | ORAL | 0 refills | Status: DC | PRN
Start: 2020-08-14 — End: 2022-05-27

## 2020-08-14 MED ORDER — ACETAMINOPHEN 500 MG PO TABS
ORAL_TABLET | ORAL | Status: AC
  Administered 2020-08-14: 1000 mg via ORAL
  Filled 2020-08-14: qty 2

## 2020-08-14 MED ORDER — FAMOTIDINE 20 MG PO TABS: 20.0000 mg | ORAL_TABLET | Freq: Once | ORAL | Status: AC

## 2020-08-14 MED ORDER — PROPOFOL 10 MG/ML IV BOLUS
INTRAVENOUS | Status: DC | PRN
  Administered 2020-08-14: 170 mg via INTRAVENOUS

## 2020-08-14 MED ORDER — PROPOFOL 10 MG/ML IV BOLUS
INTRAVENOUS | Status: AC
  Filled 2020-08-14: qty 20

## 2020-08-14 MED ORDER — CHLORHEXIDINE GLUCONATE 0.12 % MT SOLN
OROMUCOSAL | Status: AC
  Administered 2020-08-14: 15 mL via OROMUCOSAL
  Filled 2020-08-14: qty 15

## 2020-08-14 MED ORDER — SUCCINYLCHOLINE CHLORIDE 20 MG/ML IJ SOLN
INTRAMUSCULAR | Status: DC | PRN
  Administered 2020-08-14: 100 mg via INTRAVENOUS

## 2020-08-14 MED ORDER — MIDAZOLAM HCL 2 MG/2ML IJ SOLN
INTRAMUSCULAR | Status: DC | PRN
  Administered 2020-08-14: 2 mg via INTRAVENOUS

## 2020-08-14 MED ORDER — CHLORHEXIDINE GLUCONATE 0.12 % MT SOLN: 15.0000 mL | Freq: Once | OROMUCOSAL | Status: AC

## 2020-08-14 MED ORDER — IBUPROFEN 800 MG PO TABS: 800.0000 mg | ORAL_TABLET | Freq: Three times a day (TID) | ORAL | 0 refills | Status: DC | PRN

## 2020-08-14 MED ORDER — FENTANYL CITRATE (PF) 100 MCG/2ML IJ SOLN
INTRAMUSCULAR | Status: DC | PRN
Start: 2020-08-14 — End: 2020-08-14
  Administered 2020-08-14 (×2): 50 ug via INTRAVENOUS

## 2020-08-14 MED ORDER — DOCUSATE SODIUM 100 MG PO CAPS: 100.0000 mg | ORAL_CAPSULE | Freq: Two times a day (BID) | ORAL | 0 refills | Status: AC | PRN

## 2020-08-14 MED ORDER — LIDOCAINE HCL (CARDIAC) PF 100 MG/5ML IV SOSY
PREFILLED_SYRINGE | INTRAVENOUS | Status: DC | PRN
  Administered 2020-08-14: 100 mg via INTRAVENOUS

## 2020-08-14 MED ORDER — FAMOTIDINE 20 MG PO TABS
ORAL_TABLET | ORAL | Status: AC
  Administered 2020-08-14: 20 mg via ORAL
  Filled 2020-08-14: qty 1

## 2020-08-14 MED ORDER — FENTANYL CITRATE (PF) 100 MCG/2ML IJ SOLN
25.0000 ug | INTRAMUSCULAR | Status: DC | PRN
  Administered 2020-08-14 (×2): 25 ug via INTRAVENOUS

## 2020-08-14 MED ORDER — ACETAMINOPHEN 325 MG PO TABS: 650.0000 mg | ORAL_TABLET | Freq: Three times a day (TID) | ORAL | 0 refills | Status: AC | PRN

## 2020-08-14 MED ORDER — GABAPENTIN 300 MG PO CAPS: 300.0000 mg | ORAL_CAPSULE | ORAL | Status: AC

## 2020-08-14 MED ORDER — CHLORHEXIDINE GLUCONATE CLOTH 2 % EX PADS: 6.0000 | MEDICATED_PAD | Freq: Once | CUTANEOUS | Status: DC

## 2020-08-14 MED ORDER — FENTANYL CITRATE (PF) 100 MCG/2ML IJ SOLN
INTRAMUSCULAR | Status: AC
  Filled 2020-08-14: qty 2

## 2020-08-14 MED ORDER — CELECOXIB 200 MG PO CAPS: 200.0000 mg | ORAL_CAPSULE | ORAL | Status: AC

## 2020-08-14 MED ORDER — CELECOXIB 200 MG PO CAPS
ORAL_CAPSULE | ORAL | Status: AC
  Administered 2020-08-14: 200 mg via ORAL
  Filled 2020-08-14: qty 1

## 2020-08-14 MED ORDER — BUPIVACAINE-EPINEPHRINE 0.25% -1:200000 IJ SOLN
INTRAMUSCULAR | Status: DC | PRN
  Administered 2020-08-14: 1.5 mL

## 2020-08-14 MED ORDER — MIDAZOLAM HCL 2 MG/2ML IJ SOLN
INTRAMUSCULAR | Status: AC
  Filled 2020-08-14: qty 2

## 2020-08-14 MED ORDER — ORAL CARE MOUTH RINSE: 15.0000 mL | Freq: Once | OROMUCOSAL | Status: AC

## 2020-08-14 MED ORDER — ACETAMINOPHEN 500 MG PO TABS: 1000.0000 mg | ORAL_TABLET | ORAL | Status: AC

## 2020-08-14 SURGICAL SUPPLY — 27 items
BLADE SURG 15 STRL LF DISP TIS (BLADE) ×1 IMPLANT
BLADE SURG 15 STRL SS (BLADE) ×2
CANISTER SUCT 1200ML W/VALVE (MISCELLANEOUS) ×3 IMPLANT
CHLORAPREP W/TINT 26 (MISCELLANEOUS) ×3 IMPLANT
COVER WAND RF STERILE (DRAPES) ×3 IMPLANT
DRAPE LAPAROTOMY 100X77 ABD (DRAPES) ×3 IMPLANT
ELECT REM PT RETURN 9FT ADLT (ELECTROSURGICAL) ×3
ELECTRODE REM PT RTRN 9FT ADLT (ELECTROSURGICAL) ×1 IMPLANT
GAUZE PACKING IODOFORM 1/2 (PACKING) ×3 IMPLANT
GAUZE SPONGE 4X4 12PLY STRL (GAUZE/BANDAGES/DRESSINGS) ×3 IMPLANT
GLOVE BIOGEL PI IND STRL 7.0 (GLOVE) ×1 IMPLANT
GLOVE BIOGEL PI INDICATOR 7.0 (GLOVE) ×2
GLOVE SURG SYN 6.5 ES PF (GLOVE) ×6 IMPLANT
GOWN STRL REUS W/ TWL LRG LVL3 (GOWN DISPOSABLE) ×2 IMPLANT
GOWN STRL REUS W/TWL LRG LVL3 (GOWN DISPOSABLE) ×4
LABEL OR SOLS (LABEL) ×3 IMPLANT
NEEDLE HYPO 22GX1.5 SAFETY (NEEDLE) ×3 IMPLANT
NS IRRIG 500ML POUR BTL (IV SOLUTION) ×3 IMPLANT
PACK BASIN MINOR (MISCELLANEOUS) ×3 IMPLANT
SUT MNCRL 4-0 (SUTURE) ×2
SUT MNCRL 4-0 27XMFL (SUTURE) ×1
SUT VIC AB 2-0 CT2 27 (SUTURE) ×3 IMPLANT
SUT VIC AB 3-0 SH 27 (SUTURE) ×2
SUT VIC AB 3-0 SH 27X BRD (SUTURE) ×1 IMPLANT
SUTURE MNCRL 4-0 27XMF (SUTURE) ×1 IMPLANT
SYR 10ML LL (SYRINGE) ×3 IMPLANT
TOWEL OR 17X26 4PK STRL BLUE (TOWEL DISPOSABLE) ×3 IMPLANT

## 2020-08-14 NOTE — Anesthesia Preprocedure Evaluation (Addendum)
Anesthesia Evaluation  Patient identified by MRN, date of birth, ID band Patient awake    Reviewed: Allergy & Precautions, NPO status , Patient's Chart, lab work & pertinent test results  History of Anesthesia Complications Negative for: history of anesthetic complications  Airway Mallampati: III       Dental  (+) Loose, Chipped, Missing   Pulmonary asthma , neg sleep apnea, neg COPD, Current Smoker and Patient abstained from smoking.,           Cardiovascular (-) hypertension(-) Past MI and (-) CHF (-) dysrhythmias (-) Valvular Problems/Murmurs     Neuro/Psych Seizures - (none in last 15 years),  Depression    GI/Hepatic GERD  Medicated and Controlled,(+)     substance abuse  cocaine use and marijuana use,   Endo/Other  neg diabetes  Renal/GU negative Renal ROS     Musculoskeletal   Abdominal   Peds  Hematology  (+) anemia ,   Anesthesia Other Findings   Reproductive/Obstetrics                            Anesthesia Physical Anesthesia Plan  ASA: III  Anesthesia Plan: General   Post-op Pain Management:    Induction: Intravenous  PONV Risk Score and Plan: 2 and Ondansetron and Dexamethasone  Airway Management Planned: Oral ETT  Additional Equipment:   Intra-op Plan:   Post-operative Plan:   Informed Consent: I have reviewed the patients History and Physical, chart, labs and discussed the procedure including the risks, benefits and alternatives for the proposed anesthesia with the patient or authorized representative who has indicated his/her understanding and acceptance.       Plan Discussed with:   Anesthesia Plan Comments:         Anesthesia Quick Evaluation

## 2020-08-14 NOTE — Transfer of Care (Signed)
Immediate Anesthesia Transfer of Care Note  Patient: Dorothy Chen  Procedure(s) Performed: INCISION AND DRAINAGE ABSCESS- left axilla (Left )  Patient Location: PACU  Anesthesia Type:General  Level of Consciousness: awake, oriented, drowsy and patient cooperative  Airway & Oxygen Therapy: Patient Spontanous Breathing  Post-op Assessment: Report given to RN and Post -op Vital signs reviewed and stable  Post vital signs: Reviewed and stable  Last Vitals:  Vitals Value Taken Time  BP 111/59 08/14/20 0914  Temp 36.4 C 08/14/20 0914  Pulse 69 08/14/20 0925  Resp 20 08/14/20 0925  SpO2 100 % 08/14/20 0925    Last Pain:  Vitals:   08/14/20 0914  TempSrc:   PainSc: 0-No pain         Complications: No complications documented.

## 2020-08-14 NOTE — Op Note (Signed)
Preoperative diagnosis: left axillary abscexx Postoperative diagnosis: same  Procedure: Incision and drainage of left axillary abscess  Anesthesia: GETA  Surgeon: Lysle Pearl  Wound Classification: Contaminated  Indications: Patient is a 34 y.o. female  presented with left axillary abscess secondary to hidradinitis.  See H&P for further details.  Specimen: none  Complications: None  Estimated Blood Loss: 41mL  Findings:  1. Abscess pocket within left axilla 2. purulent secretions drained and cultured 3. Adequate hemostasis.   Description of procedure: The patient was placed in the supine position and GET anesthesia was induced. The area was prepped and draped in the usual sterile fashion. A timeout was completed verifying correct patient, procedure, site, positioning, and implant(s) and/or special equipment prior to beginning this procedure.  Local infused over planned incision site.  Inicision made over most indurated area and purulent secretions was drained. With a hemostat blunt dissection of septas performed to drain the abscess completely, cavity measruing approx 4cm x 5cm x 4cm deep. The wound then irrigated, hemostasis confirmed and then packed with an iodine packing, dressed with abd pads and secured with paper tape.  The patient tolerated the procedure well and was taken to the postanesthesia care unit in satisfactory condition.

## 2020-08-14 NOTE — Anesthesia Procedure Notes (Signed)
Procedure Name: Intubation Date/Time: 08/14/2020 8:38 AM Performed by: Debe Coder, CRNA Pre-anesthesia Checklist: Patient identified, Emergency Drugs available, Suction available and Patient being monitored Patient Re-evaluated:Patient Re-evaluated prior to induction Oxygen Delivery Method: Circle system utilized Preoxygenation: Pre-oxygenation with 100% oxygen Induction Type: IV induction Ventilation: Mask ventilation without difficulty Laryngoscope Size: Mac and 3 Grade View: Grade I Tube type: Oral Number of attempts: 1 Airway Equipment and Method: Stylet and Oral airway Placement Confirmation: ETT inserted through vocal cords under direct vision,  positive ETCO2 and breath sounds checked- equal and bilateral Secured at: 21 cm Tube secured with: Tape Dental Injury: Teeth and Oropharynx as per pre-operative assessment

## 2020-08-14 NOTE — Discharge Instructions (Signed)
AMBULATORY SURGERY  DISCHARGE INSTRUCTIONS   1) The drugs that you were given will stay in your system until tomorrow so for the next 24 hours you should not:  A) Drive an automobile B) Make any legal decisions C) Drink any alcoholic beverage   2) You may resume regular meals tomorrow.  Today it is better to start with liquids and gradually work up to solid foods.  You may eat anything you prefer, but it is better to start with liquids, then soup and crackers, and gradually work up to solid foods.   3) Please notify your doctor immediately if you have any unusual bleeding, trouble breathing, redness and pain at the surgery site, drainage, fever, or pain not relieved by medication.    4) Additional Instructions:        Please contact your physician with any problems or Same Day Surgery at [phone], Monday through Friday 6 am to 4 pm, or [place] at [facility] number at [phone].  I&D, Care After This sheet gives you information about how to care for yourself after your procedure. Your health care provider may also give you more specific instructions. If you have problems or questions, contact your health care provider. What can I expect after the procedure? After the procedure, it is common to have:  Soreness.  Bruising.  Itching. Follow these instructions at home: site care Follow instructions from your health care provider about how to take care of your site. Make sure you:  Wash your hands with soap and water before and after you change your bandage (dressing). If soap and water are not available, use hand sanitizer.  Keep dressing intact until followup in office tomorrow.  If dressing falls out, replace with new dressing and keep area covered until followup appointment  If the area bleeds or bruises, apply gentle pressure for 10 minutes.  OK TO SHOWER IN 24HRS  Check your site every day for signs of infection. Check for:  Redness, swelling, or  pain.  Fluid or blood.  Warmth.  Pus or a bad smell.  General instructions  Rest and then return to your normal activities as told by your health care provider. .  tylenol and advil as needed for discomfort.  Please alternate between the two every four hours as needed for pain.   .  Use narcotics, if prescribed, only when tylenol and motrin is not enough to control pain. .  325-650mg  every 8hrs to max of 3000mg /24hrs (including the 325mg  in every norco dose) for the tylenol.   .  Advil up to 800mg  per dose every 8hrs as needed for pain.    Keep all follow-up visits as told by your health care provider. This is important. Contact a health care provider if:  You have redness, swelling, or pain around your site.  You have fluid or blood coming from your site.  Your site feels warm to the touch.  You have pus or a bad smell coming from your site.  You have a fever.  Your sutures, skin glue, or adhesive strips loosen or come off sooner than expected. Get help right away if:  You have bleeding that does not stop with pressure or a dressing. Summary  After the procedure, it is common to have some soreness, bruising, and itching at the site.  Follow instructions from your health care provider about how to take care of your site.  Check your site every day for signs of infection.  Contact a health  care provider if you have redness, swelling, or pain around your site, or your site feels warm to the touch.  Keep all follow-up visits as told by your health care provider. This is important. This information is not intended to replace advice given to you by your health care provider. Make sure you discuss any questions you have with your health care provider. Document Released: 11/14/2015 Document Revised: 04/17/2018 Document Reviewed: 04/17/2018 Elsevier Interactive Patient Education  [employer].

## 2020-08-14 NOTE — Interval H&P Note (Signed)
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  The various methods of treatment have been discussed with the patient and family. After consideration of risks, benefits and other options for treatment, the patient has consented to  Procedure(s): INCISION AND DRAINAGE ABSCESS- left axilla (Left) as a surgical intervention.  The patient's history has been reviewed, patient examined, no change in status, stable for surgery.  I have reviewed the patient's chart and labs.  Questions were answered to the patient's satisfaction.     Aviya Jarvie Lysle Pearl

## 2020-08-14 NOTE — Anesthesia Postprocedure Evaluation (Signed)
Anesthesia Post Note  Patient: Dorothy Chen  Procedure(s) Performed: INCISION AND DRAINAGE ABSCESS- left axilla (Left )  Patient location during evaluation: PACU Anesthesia Type: General Level of consciousness: awake and alert Pain management: pain level controlled Vital Signs Assessment: post-procedure vital signs reviewed and stable Respiratory status: spontaneous breathing and respiratory function stable Cardiovascular status: stable Anesthetic complications: no   No complications documented.   Last Vitals:  Vitals:   08/14/20 0954 08/14/20 1000  BP:  120/72  Pulse: (!) 49 (!) 54  Resp: 12 16  Temp: (!) 36.1 C (!) 36.1 C  SpO2: 99% 100%    Last Pain:  Vitals:   08/14/20 1000  TempSrc: Temporal  PainSc: 4                  Matheo Rathbone K

## 2021-06-11 ENCOUNTER — Emergency Department
Admission: EM | Admit: 2021-06-11 | Discharge: 2021-06-11 | Disposition: A | Discharge status: Home / Self Care | Payer: Medicaid Other | Attending: Emergency Medicine | Admitting: Emergency Medicine

## 2021-06-11 ENCOUNTER — Encounter: Payer: Self-pay | Admitting: Emergency Medicine

## 2021-06-11 ENCOUNTER — Other Ambulatory Visit: Payer: Self-pay

## 2021-06-11 ENCOUNTER — Emergency Department: Payer: Medicaid Other

## 2021-06-11 DIAGNOSIS — F17210 Nicotine dependence, cigarettes, uncomplicated: Secondary | ICD-10-CM | POA: Insufficient documentation

## 2021-06-11 DIAGNOSIS — R079 Chest pain, unspecified: Secondary | ICD-10-CM | POA: Insufficient documentation

## 2021-06-11 DIAGNOSIS — J45909 Unspecified asthma, uncomplicated: Secondary | ICD-10-CM | POA: Insufficient documentation

## 2021-06-11 DIAGNOSIS — F4321 Adjustment disorder with depressed mood: Secondary | ICD-10-CM

## 2021-06-11 DIAGNOSIS — F430 Acute stress reaction: Secondary | ICD-10-CM | POA: Insufficient documentation

## 2021-06-11 LAB — TROPONIN I (HIGH SENSITIVITY)
Troponin I (High Sensitivity): 3 ng/L (ref <18)
Troponin I (High Sensitivity): 3 ng/L (ref <18)

## 2021-06-11 LAB — BASIC METABOLIC PANEL
Anion gap: 7 (ref 5–15)
BUN: 14 mg/dL (ref 6–20)
CO2: 28 mmol/L (ref 22–32)
Calcium: 8.9 mg/dL (ref 8.9–10.3)
Chloride: 103 mmol/L (ref 98–111)
Creatinine, Ser: 0.79 mg/dL (ref 0.44–1.00)
GFR, Estimated: >60 mL/min (ref >60)
Glucose, Bld: 89 mg/dL (ref 70–99)
Potassium: 4 mmol/L (ref 3.5–5.1)
Sodium: 138 mmol/L (ref 135–145)

## 2021-06-11 LAB — CBC
HCT: 35.3 % — ABNORMAL LOW (ref 36.0–46.0)
Hemoglobin: 11.3 g/dL — ABNORMAL LOW (ref 12.0–15.0)
MCH: 23.9 pg — ABNORMAL LOW (ref 26.0–34.0)
MCHC: 32 g/dL (ref 30.0–36.0)
MCV: 74.8 fL — ABNORMAL LOW (ref 80.0–100.0)
Platelets: 254 10*3/uL (ref 150–400)
RBC: 4.72 MIL/uL (ref 3.87–5.11)
RDW: 18.6 % — ABNORMAL HIGH (ref 11.5–15.5)
WBC: 5.8 10*3/uL (ref 4.0–10.5)
nRBC: 0 % (ref 0.0–0.2)

## 2021-06-11 MED ORDER — LORAZEPAM 1 MG PO TABS
1.0000 mg | ORAL_TABLET | Freq: Once | ORAL | Status: AC
  Administered 2021-06-11: 1 mg via ORAL
  Filled 2021-06-11: qty 1

## 2021-06-11 MED ORDER — ALPRAZOLAM 0.25 MG PO TABS: 0.2500 mg | ORAL_TABLET | Freq: Three times a day (TID) | ORAL | 0 refills | Status: DC | PRN

## 2021-06-11 NOTE — ED Provider Notes (Signed)
[facility] Emergency Department Provider Note  ____________________________________________   Event Date/Time   First MD Initiated Contact with Patient 06/11/21 1126     (approximate)  I have reviewed the triage vital signs and the nursing notes.   HISTORY  Chief Complaint Chest Pain    HPI Dorothy Chen is a 35 y.o. female presents emergency department complaining of chest pain.  Patient states she was at work and had a squeezing type chest pain in the center of her chest.  Patient states her mother recently passed away and she has had a lot of anxiety concerning this.  Patient states that Dr. Yancey Flemings told her she had had a mini heart attack at some point.  She denies any shortness of breath.  She states just a squeezing sensation.  No vomiting or diaphoresis  Past Medical History:  Diagnosis Date   Anemia    Asthma    Depression    GERD (gastroesophageal reflux disease)    Seizure ([place])    Pt reports she would have seizures when she got overheated until about 15 yrs ago.  None since. Never any meds.(09/2019)    There are no problems to display for this patient.   Past Surgical History:  Procedure Laterality Date   CESAREAN SECTION  2009,2011,2014   CHOLECYSTECTOMY  2018   INCISION AND DRAINAGE ABSCESS Left 08/14/2020   Procedure: INCISION AND DRAINAGE ABSCESS- left axilla;  Surgeon: Benjamine Sprague, DO;  Location: [facility];  Service: General;  Laterality: Left;   TONSILLECTOMY Bilateral 09/20/2019   Procedure: TONSILLECTOMY;  Surgeon: Margaretha Sheffield, MD;  Location: [place];  Service: ENT;  Laterality: Bilateral;   TONSILLECTOMY      Prior to Admission medications   Medication Sig Start Date End Date Taking? Authorizing Provider  ALPRAZolam (XANAX) 0.25 MG tablet Take 1 tablet (0.25 mg total) by mouth 3 (three) times daily as needed for anxiety. 06/11/21 06/11/22 Yes [place], Linden Dolin, PA-C  albuterol (VENTOLIN HFA) 108 (90 Base) MCG/ACT  inhaler Inhale 1-2 puffs into the lungs every 6 (six) hours as needed for wheezing or shortness of breath.     [provider]  HYDROcodone-acetaminophen (NORCO) 5-325 MG tablet Take 1 tablet by mouth every 6 (six) hours as needed for up to 6 doses for moderate pain. 08/14/20   Lysle Pearl, Isami, DO  ibuprofen (ADVIL) 800 MG tablet Take 1 tablet (800 mg total) by mouth every 8 (eight) hours as needed for mild pain or moderate pain. 08/14/20   Lysle Pearl, Isami, DO  Simethicone (GAS-X PO) Take 1 tablet by mouth daily as needed (gas).    [provider]    Allergies Amoxicillin and Penicillins  History reviewed. No pertinent family history.  Social History Social History   Tobacco Use   Smoking status: Every Day    Packs/day: 0.50    Years: 17.00    Pack years: 8.50    Types: Cigarettes   Smokeless tobacco: Never   Tobacco comments:    since age 3  Vaping Use   Vaping Use: Never used  Substance Use Topics   Alcohol use: Yes    Alcohol/week: 1.0 standard drink    Types: 1 Shots of liquor per week   Drug use: Yes    Types: Marijuana    Review of Systems  Constitutional: No fever/chills Eyes: No visual changes. ENT: No sore throat. Respiratory: Denies cough Cardiovascular: Positive chest pain Gastrointestinal: Denies abdominal pain Genitourinary: Negative for dysuria. Musculoskeletal: Negative  for back pain. Skin: Negative for rash. Psychiatric: no mood changes,     ____________________________________________   PHYSICAL EXAM:  VITAL SIGNS: ED Triage Vitals  Enc Vitals Group     BP 06/11/21 0926 (!) 147/101     Pulse Rate 06/11/21 0926 71     Resp 06/11/21 0926 18     Temp 06/11/21 0926 98.6 F (37 C)     Temp Source 06/11/21 0926 Oral     SpO2 06/11/21 0926 100 %     Weight 06/11/21 0925 210 lb 1.6 oz (95.3 kg)     Height 06/11/21 0925 '5\' 1"'$  (1.549 m)     Head Circumference --      Peak Flow --      Pain Score 06/11/21 0928 7     Pain Loc --       Pain Edu? --      Excl. in [place]? --     Constitutional: Alert and oriented. Well appearing and in no acute distress.  Patient is tearful Eyes: Conjunctivae are normal.  Head: Atraumatic. Nose: No congestion/rhinnorhea. Mouth/Throat: Mucous membranes are moist.   Neck:  supple no lymphadenopathy noted Cardiovascular: Normal rate, regular rhythm. Heart sounds are normal Respiratory: Normal respiratory effort.  No retractions, lungs c t a  GU: deferred Musculoskeletal: FROM all extremities, warm and well perfused Neurologic:  Normal speech and language.  Skin:  Skin is warm, dry and intact. No rash noted. Psychiatric: Mood and affect are normal. Speech and behavior are normal.  ____________________________________________   LABS (all labs ordered are listed, but only abnormal results are displayed)  Labs Reviewed  CBC - Abnormal; Notable for the following components:      Result Value   Hemoglobin 11.3 (*)    HCT 35.3 (*)    MCV 74.8 (*)    MCH 23.9 (*)    RDW 18.6 (*)    All other components within normal limits  BASIC METABOLIC PANEL  POC URINE PREG, ED  TROPONIN I (HIGH SENSITIVITY)  TROPONIN I (HIGH SENSITIVITY)   ____________________________________________   ____________________________________________  RADIOLOGY  Chest x-ray  ____________________________________________   PROCEDURES  Procedure(s) performed: No  Procedures    ____________________________________________   INITIAL IMPRESSION / ASSESSMENT AND PLAN / ED COURSE  Pertinent labs & imaging results that were available during my care of the patient were reviewed by me and considered in my medical decision making (see chart for details).   Patient is a 35 year old female presents emergency department with chest pain.  See HPI.  Physical exam shows patient appears stable  DDx: MI, CAP, PE, anxiety  Chest x-ray reviewed by me confirmed by radiology to be negative for acute  abnormality  Labs are reassuring, CBC and basic metabolic panel are normal, first troponin is negative and second troponin is negative.  I do feel the patient is having a grief reaction secondary to her mother's passing.  Patient is very tearful and anxious.  Gave her Ativan 1 mg p.o. she is to follow-up with her regular doctor.  Return emergency department worsening.     Dorothy Chen was evaluated in Emergency Department on 06/11/2021 for the symptoms described in the history of present illness. She was evaluated in the context of the global COVID-19 pandemic, which necessitated consideration that the patient might be at risk for infection with the SARS-CoV-2 virus that causes COVID-19. Institutional protocols and algorithms that pertain to the evaluation of patients at risk for COVID-19 are in a state  of rapid change based on information released by regulatory bodies including the CDC and federal and state organizations. These policies and algorithms were followed during the patient's care in the ED.    As part of my medical decision making, I reviewed the following data within the [facility] notes reviewed and incorporated, Labs reviewed , EKG interpreted NSR, Old chart reviewed, Radiograph reviewed , Notes from prior ED visits, and [place] Controlled Substance Database  ____________________________________________   FINAL CLINICAL IMPRESSION(S) / ED DIAGNOSES  Final diagnoses:  Nonspecific chest pain  Grief reaction      NEW MEDICATIONS STARTED DURING THIS VISIT:  New Prescriptions   ALPRAZOLAM (XANAX) 0.25 MG TABLET    Take 1 tablet (0.25 mg total) by mouth 3 (three) times daily as needed for anxiety.     Note:  This document was prepared using Dragon voice recognition software and may include unintentional dictation errors.    Versie Starks, PA-C 06/11/21 1237    Duffy Bruce, MD 06/16/21 [phone]

## 2021-06-11 NOTE — ED Triage Notes (Signed)
Pt comes into the ED via [facility] from work c/o central chest pain that started today.  Pt describes it as a squeezing and crushing pain.  Pt states she has a cardiac history.  3 doses nitro and 324 ASA given.  Pt reports the pain got better.  Initially hypertensive but after nitro it was 150/78, 76 HR, 98% RA.

## 2021-06-11 NOTE — Discharge Instructions (Addendum)
Follow-up with regular doctor if not improving in 2 to 3 days.  Return emergency department if worsening.  Call Dr. Yancey Flemings if he continues to have intermittent chest pain.  He would want to do a stress test. Take the Xanax as needed for anxiety related to the loss of your mother.

## 2022-01-11 IMAGING — CR DG CHEST 2V
2 series · 2 of 2 positions shown · non-contrast
Comparison: 03/04/2020

CLINICAL DATA: Chest pain

EXAM:
CHEST - 2 VIEW

[chest lat]
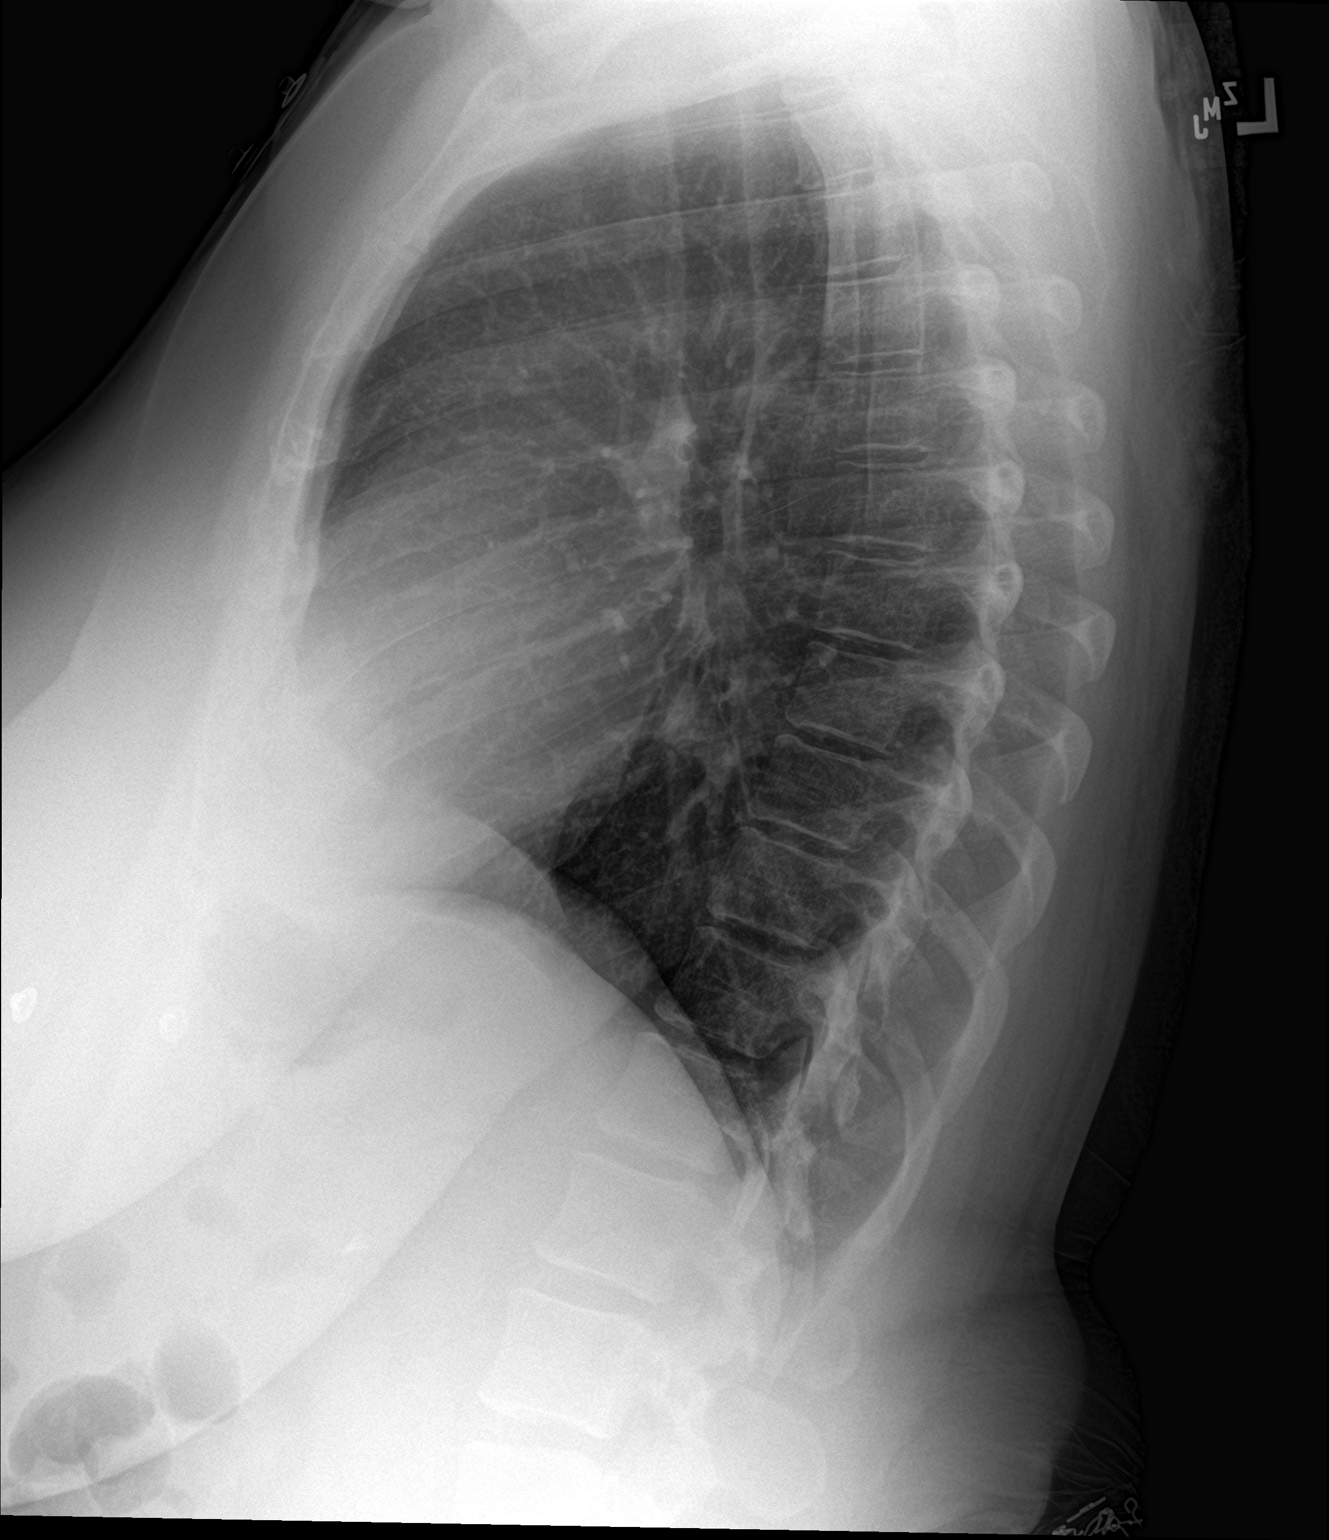

[chest pa]
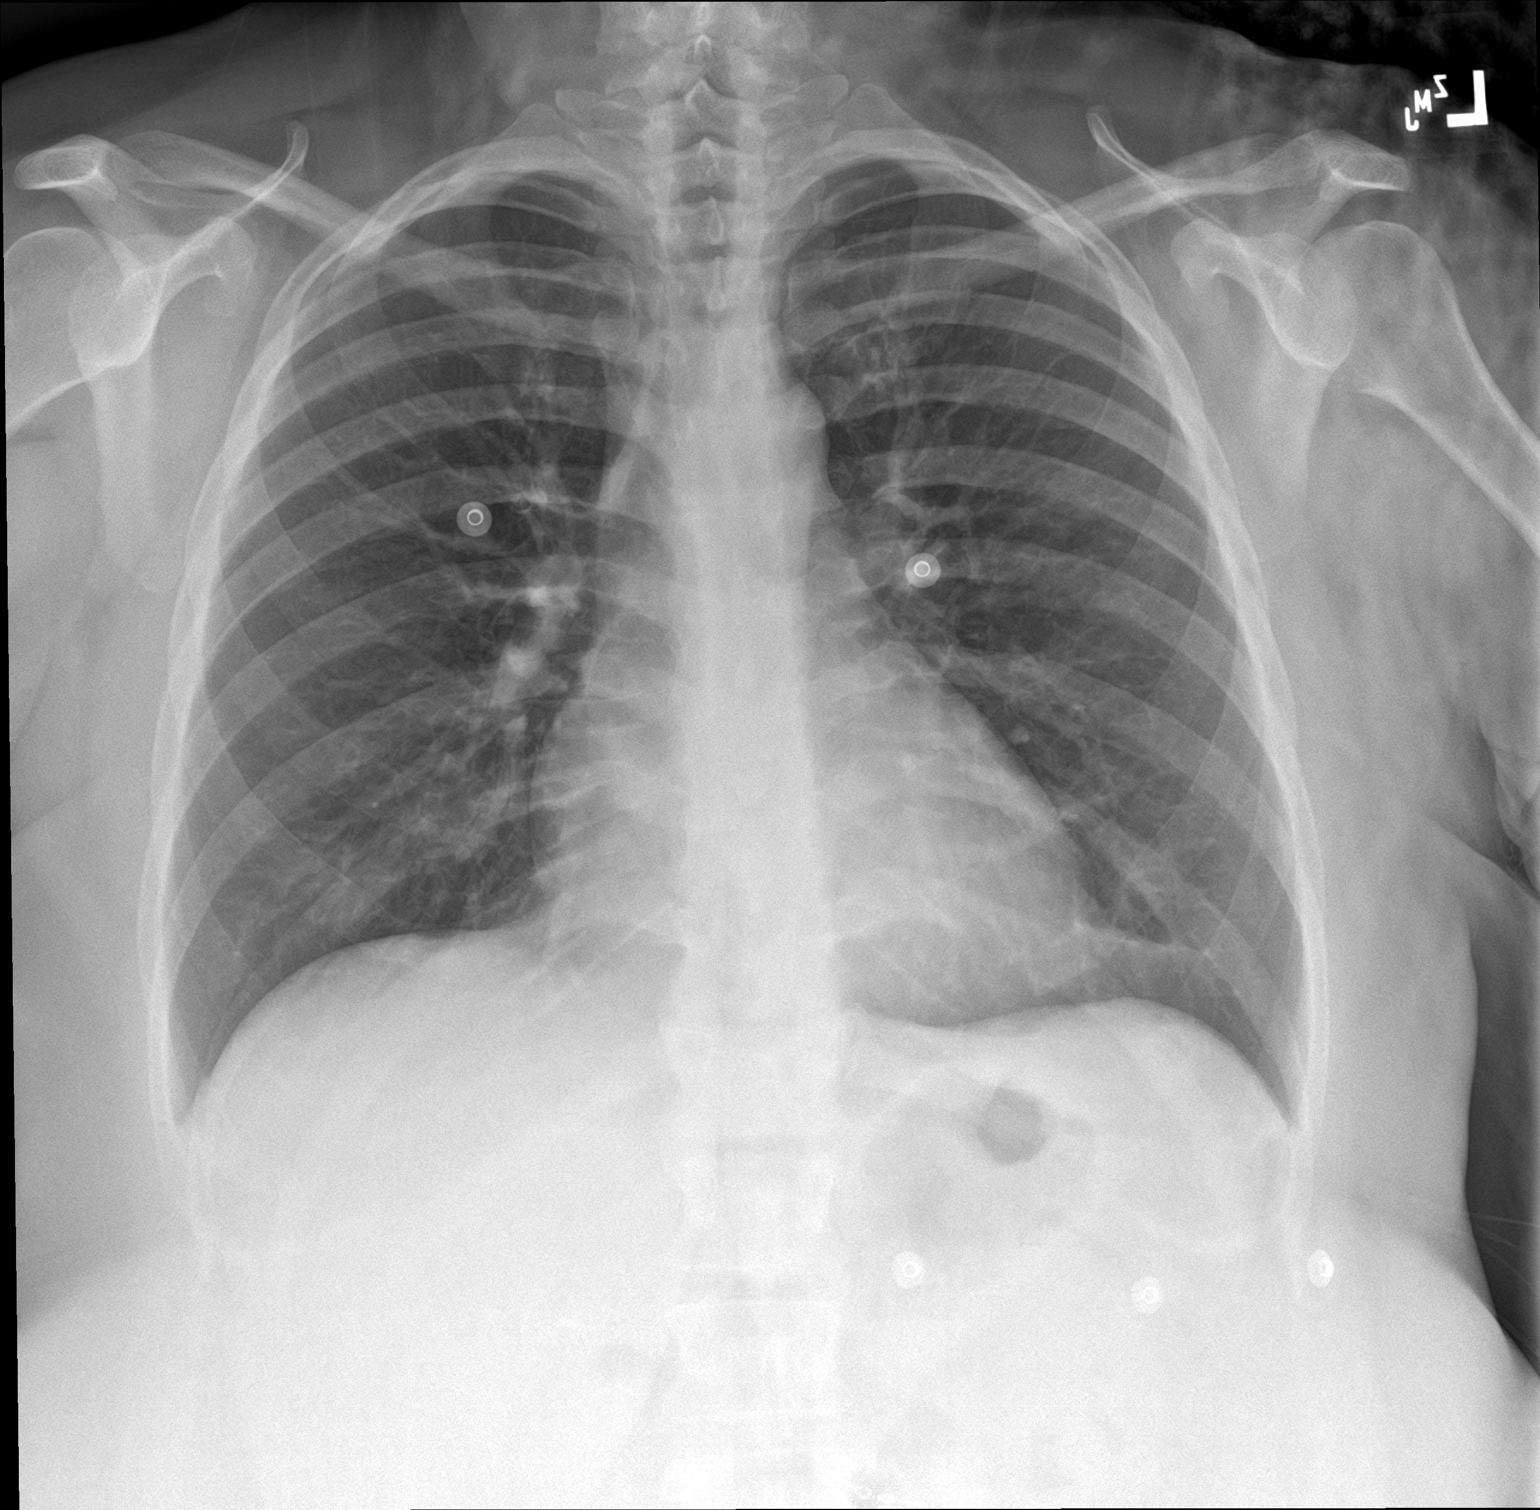

[2 of 2 positions shown; findings below may reference images not displayed]

FINDINGS: The heart size and mediastinal contours are within normal limits.
Both lungs are clear. The visualized skeletal structures are
unremarkable.
IMPRESSION: No active cardiopulmonary disease.

## 2022-05-27 ENCOUNTER — Other Ambulatory Visit: Payer: Self-pay

## 2022-05-27 ENCOUNTER — Emergency Department
Admission: EM | Admit: 2022-05-27 | Discharge: 2022-05-27 | Disposition: A | Discharge status: Home / Self Care | Payer: Medicaid Other | Attending: Emergency Medicine | Admitting: Emergency Medicine

## 2022-05-27 ENCOUNTER — Emergency Department: Payer: Medicaid Other

## 2022-05-27 ENCOUNTER — Encounter: Payer: Self-pay | Admitting: Emergency Medicine

## 2022-05-27 DIAGNOSIS — H538 Other visual disturbances: Secondary | ICD-10-CM | POA: Insufficient documentation

## 2022-05-27 DIAGNOSIS — R110 Nausea: Secondary | ICD-10-CM | POA: Insufficient documentation

## 2022-05-27 DIAGNOSIS — R030 Elevated blood-pressure reading, without diagnosis of hypertension: Secondary | ICD-10-CM | POA: Insufficient documentation

## 2022-05-27 DIAGNOSIS — L0201 Cutaneous abscess of face: Secondary | ICD-10-CM | POA: Insufficient documentation

## 2022-05-27 DIAGNOSIS — R519 Headache, unspecified: Secondary | ICD-10-CM | POA: Diagnosis present

## 2022-05-27 LAB — COMPREHENSIVE METABOLIC PANEL
ALT: 17 U/L (ref 0–44)
AST: 17 U/L (ref 15–41)
Albumin: 3.9 g/dL (ref 3.5–5.0)
Alkaline Phosphatase: 47 U/L (ref 38–126)
Anion gap: 7 (ref 5–15)
BUN: 9 mg/dL (ref 6–20)
CO2: 24 mmol/L (ref 22–32)
Calcium: 9.1 mg/dL (ref 8.9–10.3)
Chloride: 111 mmol/L (ref 98–111)
Creatinine, Ser: 0.6 mg/dL (ref 0.44–1.00)
GFR, Estimated: >60 mL/min (ref >60)
Glucose, Bld: 96 mg/dL (ref 70–99)
Potassium: 3.9 mmol/L (ref 3.5–5.1)
Sodium: 142 mmol/L (ref 135–145)
Total Bilirubin: 0.4 mg/dL (ref 0.3–1.2)
Total Protein: 7.2 g/dL (ref 6.5–8.1)

## 2022-05-27 LAB — CBC
HCT: 35.3 % — ABNORMAL LOW (ref 36.0–46.0)
Hemoglobin: 10.7 g/dL — ABNORMAL LOW (ref 12.0–15.0)
MCH: 22.7 pg — ABNORMAL LOW (ref 26.0–34.0)
MCHC: 30.3 g/dL (ref 30.0–36.0)
MCV: 74.8 fL — ABNORMAL LOW (ref 80.0–100.0)
Platelets: 280 10*3/uL (ref 150–400)
RBC: 4.72 MIL/uL (ref 3.87–5.11)
RDW: 18.1 % — ABNORMAL HIGH (ref 11.5–15.5)
WBC: 6.8 10*3/uL (ref 4.0–10.5)
nRBC: 0 % (ref 0.0–0.2)

## 2022-05-27 LAB — LIPASE, BLOOD: Lipase: 24 U/L (ref 11–51)

## 2022-05-27 MED ORDER — CLINDAMYCIN HCL 300 MG PO CAPS: 300.0000 mg | ORAL_CAPSULE | Freq: Three times a day (TID) | ORAL | 0 refills | Status: AC

## 2022-05-27 NOTE — ED Provider Notes (Signed)
[facility] Provider Note    Event Date/Time   First MD Initiated Contact with Patient 05/27/22 [phone]     (approximate)   History   Facial Pain   HPI  Dorothy Chen is a 36 y.o. female   presents to the ED with complaint of "boil" to the left side of her face for 1 week.  Patient states that she also has blurred vision and has had nausea.  She reports a fever of 103 at home.  Patient states that she has been pressing on the bowl trying to get it to open.  Initially it was noted that patient's blood pressure was 178/111 and she denies any blood pressure medication or history of hypertension.  There is positive family history of hypertension.  She denies any chest pain, shortness of breath.      Physical Exam   Triage Vital Signs: ED Triage Vitals  Enc Vitals Group     BP 05/27/22 0929 (!) 178/111     Pulse Rate 05/27/22 0929 85     Resp 05/27/22 0929 18     Temp 05/27/22 0929 98.4 F (36.9 C)     Temp Source 05/27/22 0929 Oral     SpO2 05/27/22 0929 100 %     Weight 05/27/22 0930 210 lb (95.3 kg)     Height 05/27/22 0930 5' (1.524 m)     Head Circumference --      Peak Flow --      Pain Score 05/27/22 0933 10     Pain Loc --      Pain Edu? --      Excl. in [place]? --     Most recent vital signs: Vitals:   05/27/22 1030 05/27/22 1159  BP: (!) 140/96 140/80  Pulse:  80  Resp:  18  Temp:    SpO2:  100%     General: Awake, no distress.  CV:  Good peripheral perfusion.  Heart rate of rate and rhythm. Resp:  Normal effort.  Lungs are clear bilaterally. Abd:  No distention.  Other:  Left cheek there is a 1 cm papule present without active draining.  Moderate tenderness to palpation but no surrounding cellulitis noted.  Neck is supple without cervical lymphadenopathy.   ED Results / Procedures / Treatments   Labs (all labs ordered are listed, but only abnormal results are displayed) Labs Reviewed  CBC - Abnormal; Notable for the  following components:      Result Value   Hemoglobin 10.7 (*)    HCT 35.3 (*)    MCV 74.8 (*)    MCH 22.7 (*)    RDW 18.1 (*)    All other components within normal limits  LIPASE, BLOOD  COMPREHENSIVE METABOLIC PANEL  POC URINE PREG, ED       PROCEDURES:  Critical Care performed:   Procedures   MEDICATIONS ORDERED IN ED: Medications - No data to display   IMPRESSION / MDM / [place] / ED COURSE  I reviewed the triage vital signs and the nursing notes.   Differential diagnosis includes, but is not limited to, facial abscess, cellulitis, carbuncle, facial injury.  36 year old female presents to the ED with a area on her left cheek that has been there for 1 week.  Patient denies injury to her face.  She has been picking at this occasionally and denies any drainage from the area.  Patient complains of blurred vision and some nausea.  When asked  if she is on any blood pressure medication as her initial blood pressure was 178/111 she states that she has never been prescribed medication but there is a strong family history of hypertension.  Visual acuity was reviewed and was noted to be the same in both eyes although patient has complaints about her vision in her left eye.  CT scan head was reassuring and patient was made aware that she had not had a acute intracranial injury causing her change in vision.  We discussed in length the need for her to follow-up with her PCP at [facility] to be reevaluated for hypertension.  Patient was discharged with a prescription for clindamycin 300 mg 3 times daily for 10 days and to use warm moist compresses to the area frequently.  She is strongly encouraged not to squeeze or pick at the area on her face.  She return to the emergency department if any severe worsening of her condition.      Patient's presentation is most consistent with acute complicated illness / injury requiring diagnostic workup.  FINAL CLINICAL  IMPRESSION(S) / ED DIAGNOSES   Final diagnoses:  Cutaneous abscess of face  Elevated blood pressure reading     Rx / DC Orders   ED Discharge Orders          Ordered    clindamycin (CLEOCIN) 300 MG capsule  3 times daily        05/27/22 1138             Note:  This document was prepared using Dragon voice recognition software and may include unintentional dictation errors.   Johnn Hai, PA-C 05/27/22 1436    Naaman Plummer, MD 05/28/22 [phone]

## 2022-05-27 NOTE — ED Triage Notes (Signed)
Pt reports "boil" to the left side of her face x 1 week. Pt states at this time her vision is blurry and she is nauseas. Pt reports fever of 103 at home.

## 2022-05-27 NOTE — Discharge Instructions (Addendum)
Call today make an appointment [facility] to have your blood pressure reevaluated.  Is important that you your reevaluated for your blood pressures been elevated in the emergency department.  Today initial blood pressure was 178/111 and before discharge was 140/96.  Nontreated hypertension can put you at higher risk for heart attack or a stroke.  Been taking antibiotics as directed until completely gone.  You should use warm moist compresses such as a wet cloth frequently for the next 2 to 3 days.  Do not squeeze or poke at this area on your face as it could increase infection.

## 2022-06-15 ENCOUNTER — Encounter: Payer: Medicaid Other | Admitting: Obstetrics & Gynecology

## 2022-06-23 ENCOUNTER — Encounter: Payer: Self-pay | Admitting: Obstetrics & Gynecology

## 2022-07-07 ENCOUNTER — Telehealth: Payer: Self-pay | Admitting: Obstetrics & Gynecology

## 2022-07-07 ENCOUNTER — Other Ambulatory Visit (HOSPITAL_COMMUNITY)
Admission: RE | Admit: 2022-07-07 | Discharge: 2022-07-07 | Disposition: A | Discharge status: Home / Self Care | Payer: Medicaid Other | Source: Ambulatory Visit | Attending: Obstetrics & Gynecology | Admitting: Obstetrics & Gynecology

## 2022-07-07 ENCOUNTER — Ambulatory Visit (INDEPENDENT_AMBULATORY_CARE_PROVIDER_SITE_OTHER): Payer: Medicaid Other | Admitting: Obstetrics & Gynecology

## 2022-07-07 ENCOUNTER — Encounter: Payer: Self-pay | Admitting: Obstetrics & Gynecology

## 2022-07-07 VITALS — BP 150/102 | HR 81 | Resp 16 | Wt 200.4 lb

## 2022-07-07 DIAGNOSIS — Z124 Encounter for screening for malignant neoplasm of cervix: Secondary | ICD-10-CM | POA: Insufficient documentation

## 2022-07-07 DIAGNOSIS — Z113 Encounter for screening for infections with a predominantly sexual mode of transmission: Secondary | ICD-10-CM | POA: Insufficient documentation

## 2022-07-07 DIAGNOSIS — N920 Excessive and frequent menstruation with regular cycle: Secondary | ICD-10-CM

## 2022-07-07 NOTE — Telephone Encounter (Signed)
I called patient to let her know about her u/s that's scheduled for 07-09-22 at 10:00. Patient need to arrive at [facility] with a full bladder at 9:45.

## 2022-07-07 NOTE — Progress Notes (Signed)
Subjective:     Dorothy Chen is a 36 y.o. female here for a routine exam.  Current complaints: heavy menses with passage of clots.  Personal health questionnaire reviewed: yes.   Gynecologic History Patient's last menstrual period was 06/13/2022 (exact date). Contraception: abstinence Last Pap: not documented. Results were: had abnl cells as per pt   Obstetric History OB History  Gravida Para Term Preterm AB Living  '6 5     1 5  '$ SAB IAB Ectopic Multiple Live Births  1            # Outcome Date GA Lbr Len/2nd Weight Sex Delivery Anes PTL Lv  6 SAB           5 Para           4 Para           3 Para           2 Para           1 Para             Obstetric Comments  1st Menstrual Cycle:  10  1st Pregnancy:  16     The following portions of the patient's history were reviewed and updated as appropriate: allergies, current medications, past family history, past medical history, past social history, past surgical history, and problem list.  Review of Systems A comprehensive review of systems was negative.    Objective:    BP (!) 150/102   Pulse 81   Resp 16   Wt 200 lb 6.4 oz (90.9 kg)   LMP 06/13/2022 (Exact Date)   SpO2 99%   BMI 39.14 kg/m  General appearance: Abdomen: soft, non-tender; bowel sounds normal; no masses,  no organomegaly Pelvic: cervix normal in appearance, external genitalia normal, no adnexal masses or tenderness, no cervical motion tenderness, uterus normal size, shape, and consistency, and vagina normal without discharge Extremities: extremities normal, atraumatic, no cyanosis or edema Skin: Skin color, texture, turgor normal. No rashes or lesions    Assessment:  Cytology-PAP Cervicovaginal ancillary US Pelvic w/transvaginal   Healthy female exam.    Plan:    Follow up in: 2 weeks.  Rosario Adie, MD  08/08/2022 8:10 PM

## 2022-07-08 LAB — CERVICOVAGINAL ANCILLARY ONLY
Bacterial Vaginitis (gardnerella): POSITIVE — AB
Candida Glabrata: NEGATIVE
Candida Vaginitis: NEGATIVE
Chlamydia: NEGATIVE
Comment: NEGATIVE
Comment: NEGATIVE
Comment: NEGATIVE
Comment: NEGATIVE
Comment: NEGATIVE
Comment: NORMAL
Neisseria Gonorrhea: NEGATIVE
Trichomonas: NEGATIVE

## 2022-07-09 ENCOUNTER — Telehealth: Payer: Self-pay

## 2022-07-09 ENCOUNTER — Other Ambulatory Visit: Payer: Self-pay

## 2022-07-09 ENCOUNTER — Ambulatory Visit
Admission: RE | Admit: 2022-07-09 | Discharge: 2022-07-09 | Disposition: A | Discharge status: Home / Self Care | Payer: Medicaid Other | Source: Ambulatory Visit | Attending: Obstetrics & Gynecology | Admitting: Obstetrics & Gynecology

## 2022-07-09 DIAGNOSIS — N920 Excessive and frequent menstruation with regular cycle: Secondary | ICD-10-CM | POA: Diagnosis present

## 2022-07-09 DIAGNOSIS — N760 Acute vaginitis: Secondary | ICD-10-CM

## 2022-07-09 MED ORDER — METRONIDAZOLE 500 MG PO TABS: 500.0000 mg | ORAL_TABLET | Freq: Two times a day (BID) | ORAL | 0 refills | Status: DC

## 2022-07-09 NOTE — Telephone Encounter (Signed)
Voicemail not set up yet. Patient does not have my chart.

## 2022-07-12 ENCOUNTER — Encounter: Payer: Self-pay | Admitting: Obstetrics & Gynecology

## 2022-07-12 LAB — CYTOLOGY - PAP
Adequacy: ABSENT
Comment: NEGATIVE
Diagnosis: NEGATIVE
High risk HPV: NEGATIVE

## 2022-07-19 NOTE — Telephone Encounter (Signed)
Letter sent 07/12/22

## 2022-07-21 ENCOUNTER — Ambulatory Visit (INDEPENDENT_AMBULATORY_CARE_PROVIDER_SITE_OTHER): Payer: Medicaid Other | Admitting: Obstetrics & Gynecology

## 2022-07-21 ENCOUNTER — Encounter: Payer: Self-pay | Admitting: Obstetrics & Gynecology

## 2022-07-21 DIAGNOSIS — N921 Excessive and frequent menstruation with irregular cycle: Secondary | ICD-10-CM

## 2022-07-21 NOTE — Progress Notes (Signed)
Virtual Visit via Telephone Note  I connected with [facility] on 08/04/22 at 11:15 AM EDT by telephone and verified that I am speaking with the correct person using two identifiers.  Location: Patient: [facility] Provider: Rosario Adie, MD  08/04/2022 7:07 PM    I discussed the limitations, risks, security and privacy concerns of performing an evaluation and management service by telephone and the availability of in person appointments. I also discussed with the patient that there may be a patient responsible charge related to this service. The patient expressed understanding and agreed to proceed.   History of Present Illness: Ms Skiff would like to go over the report of her pelvic US.   Observations/Objective: No Fibroids are found.  The endometrium is 8.4 mm Discussed doing an endometrial biopsy with her next menstrual cycle Assessment and Plan: Menorrhagia, and thickened endometrium Prepare for endometrial biopsy Follow Up Instructions: Take Motrin '800mg'$  1 time dose before coming in for appt   I discussed the assessment and treatment plan with the patient. The patient was provided an opportunity to ask questions and all were answered. The patient agreed with the plan and demonstrated an understanding of the instructions.   The patient was advised to call back or seek an in-person evaluation if the symptoms worsen or if the condition fails to improve as anticipated.  I provided 20 minutes of non-face-to-face time during this encounter.   Rosario Adie, MD  Rosario Adie, MD  08/04/2022 7:07 PM

## 2022-08-02 ENCOUNTER — Encounter: Payer: Self-pay | Admitting: Obstetrics and Gynecology

## 2022-08-10 ENCOUNTER — Ambulatory Visit (INDEPENDENT_AMBULATORY_CARE_PROVIDER_SITE_OTHER): Payer: Medicaid Other | Admitting: Obstetrics and Gynecology

## 2022-08-10 ENCOUNTER — Encounter: Payer: Self-pay | Admitting: Obstetrics and Gynecology

## 2022-08-10 VITALS — BP 116/80 | Ht 61.0 in | Wt 206.0 lb

## 2022-08-10 DIAGNOSIS — N921 Excessive and frequent menstruation with irregular cycle: Secondary | ICD-10-CM

## 2022-08-10 MED ORDER — TRANEXAMIC ACID 650 MG PO TABS: 1300.0000 mg | ORAL_TABLET | Freq: Three times a day (TID) | ORAL | 0 refills | Status: AC

## 2022-08-10 NOTE — Patient Instructions (Signed)
I value your feedback and you entrusting us with your care. If you get a [facility] patient survey, I would appreciate you taking the time to let us know about your experience today. Thank you! ? ? ?

## 2022-08-10 NOTE — Progress Notes (Signed)
[facility] [place]   Chief Complaint  Patient presents with   Follow-up    EMB    HPI:      Ms. Dorothy Chen is a 36 y.o. [phone] whose LMP was Patient's last menstrual period was 08/07/2022 (exact date)., presents today for menometrorrhagia f/u. Menses are monthly, lasting 5-7 days, changing products Q30 min for 2 days with severe cramping, missing work. Takes tylenol without relief. No BTB. Had GYN u/s 9/23 with no Fibroids, endometrium is 8.4 mm. 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 H/H=10.7/35.3 7/23. Feels tired. Did OCPs for 7 months with PCP earlier this yr without sx relief. Did depo in past and doesn't want it again; had IUD that got stuck and had to be removed.  EMB discussed with Dr. Langley Gauss but given sx and hx, EMB not indicated today.  Pt with hx of HTN.  She is sexually active, no BC used. Conception ok but not trying.   Past Medical History:  Diagnosis Date   Anemia    Asthma    Depression    Family history of breast cancer    10/23 cancer genetic testing letter sent   GERD (gastroesophageal reflux disease)    Seizure ([place])    Pt reports she would have seizures when she got overheated until about 15 yrs ago.  None since. Never any meds.(09/2019)    Past Surgical History:  Procedure Laterality Date   CESAREAN SECTION  2009,2011,2014   CHOLECYSTECTOMY  2018   INCISION AND DRAINAGE ABSCESS Left 08/14/2020   Procedure: INCISION AND DRAINAGE ABSCESS- left axilla;  Surgeon: Benjamine Sprague, DO;  Location: [facility];  Service: General;  Laterality: Left;   TONSILLECTOMY Bilateral 09/20/2019   Procedure: TONSILLECTOMY;  Surgeon: Margaretha Sheffield, MD;  Location: [facility];  Service: ENT;  Laterality: Bilateral;   TONSILLECTOMY      Family History  Problem Relation Age of Onset   Congestive Heart Failure Mother    Breast cancer Paternal Aunt        61s   Breast cancer Paternal Aunt        42s   Breast cancer  Paternal Aunt        75s   Breast cancer Paternal Aunt        4s   Breast cancer Paternal Aunt        34s   Breast cancer Paternal Grandmother        not sure of age    Social History   Socioeconomic History   Marital status: Significant Other    Spouse name: Not on file   Number of children: Not on file   Years of education: Not on file   Highest education level: Not on file  Occupational History   Not on file  Tobacco Use   Smoking status: Every Day    Packs/day: 0.50    Years: 17.00    Total pack years: 8.50    Types: Cigarettes   Smokeless tobacco: Never   Tobacco comments:    since age 48  Vaping Use   Vaping Use: Never used  Substance and Sexual Activity   Alcohol use: Yes    Alcohol/week: 1.0 standard drink of alcohol    Types: 1 Shots of liquor per week   Drug use: Yes    Types: Marijuana   Sexual activity: Yes    Birth control/protection: None  Other Topics Concern   Not on  file  Social History Narrative   Not on file   Social Determinants of Health   Financial Resource Strain: Not on file  Food Insecurity: Not on file  Transportation Needs: Not on file  Physical Activity: Not on file  Stress: Not on file  Social Connections: Not on file  Intimate Partner Violence: Not on file    Outpatient Medications Prior to Visit  Medication Sig Dispense Refill   albuterol (VENTOLIN HFA) 108 (90 Base) MCG/ACT inhaler Inhale 1-2 puffs into the lungs every 6 (six) hours as needed for wheezing or shortness of breath.      amLODipine (NORVASC) 2.5 MG tablet Take 2.5 mg by mouth daily.     COLACE 100 MG capsule Take 100 mg by mouth 2 (two) times daily as needed.     FLUoxetine (PROZAC) 20 MG capsule Take 20 mg by mouth daily.     pantoprazole (PROTONIX) 40 MG tablet Take 40 mg by mouth daily.     Simethicone (GAS-X PO) Take 1 tablet by mouth daily as needed (gas).     metroNIDAZOLE (FLAGYL) 500 MG tablet Take 1 tablet (500 mg total) by mouth 2 (two) times daily.  14 tablet 0   No facility-administered medications prior to visit.      ROS:  Review of Systems  Constitutional:  Negative for fever.  Gastrointestinal:  Negative for blood in stool, constipation, diarrhea, nausea and vomiting.  Genitourinary:  Positive for menstrual problem. Negative for dyspareunia, dysuria, flank pain, frequency, hematuria, urgency, vaginal bleeding, vaginal discharge and vaginal pain.  Musculoskeletal:  Negative for back pain.  Skin:  Negative for rash.   BREAST: No symptoms   OBJECTIVE:   Vitals:  BP 116/80   Ht '5\' 1"'$  (1.549 m)   Wt 206 lb (93.4 kg)   LMP 08/07/2022 (Exact Date)   BMI 38.92 kg/m   Physical Exam Vitals reviewed.  Constitutional:      Appearance: She is well-developed.  Pulmonary:     Effort: Pulmonary effort is normal.  Musculoskeletal:        General: Normal range of motion.     Cervical back: Normal range of motion.  Skin:    General: Skin is warm and dry.  Neurological:     General: No focal deficit present.     Mental Status: She is alert and oriented to person, place, and time.     Cranial Nerves: No cranial nerve deficit.  Psychiatric:        Mood and Affect: Mood normal.        Behavior: Behavior normal.        Thought Content: Thought content normal.        Judgment: Judgment normal.     Assessment/Plan: Menometrorrhagia - Plan: tranexamic acid (LYSTEDA) 650 MG TABS tablet; neg GYN u/s. Prog only options discussed, including aygestin (my recommendation to stop menses); IUD, ablation, lysteda. Pt would like to try lysteda. Rx eRxd. Pt to f/u via [place] after menses re: sx with lysteda. If works, will RF Rx. If doesn't work, suggested at least short trial of aygestin to try to stop menses so pt can get IDD under control.   IDD--try slow Fe/colace prn. May need iron transfusions if levels continue to decrease.    Meds ordered this encounter  Medications   tranexamic acid (LYSTEDA) 650 MG TABS tablet    Sig:  Take 2 tablets (1,300 mg total) by mouth 3 (three) times daily. For 5 days with menses  Dispense:  30 tablet    Refill:  0    Order Specific Question:   Supervising Provider    Answer:   Rubie Maid [[id number]]      Return if symptoms worsen or fail to improve.  Saprina Chuong B. Natarsha Hurwitz, PA-C 08/10/2022 5:03 PM

## 2023-03-08 ENCOUNTER — Emergency Department
Admission: EM | Admit: 2023-03-08 | Discharge: 2023-03-08 | Disposition: A | Discharge status: Home / Self Care | Payer: Medicaid Other | Attending: Emergency Medicine | Admitting: Emergency Medicine

## 2023-03-08 ENCOUNTER — Emergency Department: Payer: Medicaid Other

## 2023-03-08 ENCOUNTER — Encounter: Payer: Self-pay | Admitting: Emergency Medicine

## 2023-03-08 ENCOUNTER — Other Ambulatory Visit: Payer: Self-pay

## 2023-03-08 DIAGNOSIS — R197 Diarrhea, unspecified: Secondary | ICD-10-CM | POA: Diagnosis not present

## 2023-03-08 DIAGNOSIS — R1110 Vomiting, unspecified: Secondary | ICD-10-CM | POA: Diagnosis not present

## 2023-03-08 DIAGNOSIS — R0602 Shortness of breath: Secondary | ICD-10-CM | POA: Diagnosis not present

## 2023-03-08 DIAGNOSIS — R109 Unspecified abdominal pain: Secondary | ICD-10-CM

## 2023-03-08 DIAGNOSIS — R1084 Generalized abdominal pain: Secondary | ICD-10-CM | POA: Insufficient documentation

## 2023-03-08 LAB — CBC
HCT: 39.8 % (ref 36.0–46.0)
Hemoglobin: 12.1 g/dL (ref 12.0–15.0)
MCH: 22.7 pg — ABNORMAL LOW (ref 26.0–34.0)
MCHC: 30.4 g/dL (ref 30.0–36.0)
MCV: 74.8 fL — ABNORMAL LOW (ref 80.0–100.0)
Platelets: 257 10*3/uL (ref 150–400)
RBC: 5.32 MIL/uL — ABNORMAL HIGH (ref 3.87–5.11)
RDW: 19.6 % — ABNORMAL HIGH (ref 11.5–15.5)
WBC: 7.3 10*3/uL (ref 4.0–10.5)
nRBC: 0 % (ref 0.0–0.2)

## 2023-03-08 LAB — BASIC METABOLIC PANEL
Anion gap: 10 (ref 5–15)
BUN: 13 mg/dL (ref 6–20)
CO2: 21 mmol/L — ABNORMAL LOW (ref 22–32)
Calcium: 9.2 mg/dL (ref 8.9–10.3)
Chloride: 107 mmol/L (ref 98–111)
Creatinine, Ser: 0.77 mg/dL (ref 0.44–1.00)
GFR, Estimated: >60 mL/min (ref >60)
Glucose, Bld: 152 mg/dL — ABNORMAL HIGH (ref 70–99)
Potassium: 3.4 mmol/L — ABNORMAL LOW (ref 3.5–5.1)
Sodium: 138 mmol/L (ref 135–145)

## 2023-03-08 LAB — HEPATIC FUNCTION PANEL
ALT: 21 U/L (ref 0–44)
AST: 27 U/L (ref 15–41)
Albumin: 4.4 g/dL (ref 3.5–5.0)
Alkaline Phosphatase: 44 U/L (ref 38–126)
Bilirubin, Direct: <0.1 mg/dL (ref 0.0–0.2)
Total Bilirubin: 0.7 mg/dL (ref 0.3–1.2)
Total Protein: 8.2 g/dL — ABNORMAL HIGH (ref 6.5–8.1)

## 2023-03-08 LAB — POC URINE PREG, ED: Preg Test, Ur: NEGATIVE

## 2023-03-08 LAB — TROPONIN I (HIGH SENSITIVITY)
Troponin I (High Sensitivity): 4 ng/L (ref <18)
Troponin I (High Sensitivity): 2 ng/L (ref <18)

## 2023-03-08 LAB — LIPASE, BLOOD: Lipase: 23 U/L (ref 11–51)

## 2023-03-08 MED ORDER — DICYCLOMINE HCL 20 MG PO TABS: 20.0000 mg | ORAL_TABLET | Freq: Four times a day (QID) | ORAL | 0 refills | Status: AC | PRN

## 2023-03-08 MED ORDER — SODIUM CHLORIDE 0.9 % IV BOLUS
1000.0000 mL | Freq: Once | INTRAVENOUS | Status: AC
  Administered 2023-03-08: 1000 mL via INTRAVENOUS

## 2023-03-08 MED ORDER — IOHEXOL 300 MG/ML  SOLN
100.0000 mL | Freq: Once | INTRAMUSCULAR | Status: AC | PRN
  Administered 2023-03-08: 100 mL via INTRAVENOUS

## 2023-03-08 MED ORDER — MORPHINE SULFATE (PF) 4 MG/ML IV SOLN
4.0000 mg | Freq: Once | INTRAVENOUS | Status: AC
  Administered 2023-03-08: 4 mg via INTRAVENOUS
  Filled 2023-03-08: qty 1

## 2023-03-08 MED ORDER — ONDANSETRON HCL 4 MG/2ML IJ SOLN
4.0000 mg | Freq: Once | INTRAMUSCULAR | Status: AC
  Administered 2023-03-08: 4 mg via INTRAVENOUS
  Filled 2023-03-08: qty 2

## 2023-03-08 MED ORDER — ONDANSETRON HCL 4 MG PO TABS: 4.0000 mg | ORAL_TABLET | Freq: Four times a day (QID) | ORAL | 0 refills | Status: AC | PRN

## 2023-03-08 NOTE — ED Provider Notes (Signed)
[facility] Provider Note    Event Date/Time   First MD Initiated Contact with Patient 03/08/23 1139     (approximate)   History   Chest Pain   HPI  Dorothy Chen is a 37 y.o. female presenting to the emergency department for evaluation of abdominal pain.  Few days ago had onset of multiple episodes of nonbloody vomiting and diarrhea.  Today developed worsening abdominal pain described as sharp, radiating throughout her abdomen and up into her chest.  Does report some associated shortness of breath.  Known sick contact with similar symptoms recently.  Reports this feels similarly to when she had a "gallbladder attack", s/p cholecystectomy.     Physical Exam   Triage Vital Signs: ED Triage Vitals  Enc Vitals Group     BP 03/08/23 1015 (!) 138/95     Pulse Rate 03/08/23 1015 (!) 105     Resp 03/08/23 1015 18     Temp 03/08/23 1015 98.8 F (37.1 C)     Temp Source 03/08/23 1015 Oral     SpO2 03/08/23 1015 100 %     Weight --      Height --      Head Circumference --      Peak Flow --      Pain Score 03/08/23 1013 8     Pain Loc --      Pain Edu? --      Excl. in [place]? --     Most recent vital signs: Vitals:   03/08/23 1015  BP: (!) 138/95  Pulse: (!) 105  Resp: 18  Temp: 98.8 F (37.1 C)  SpO2: 100%     General: Adult female, laying in bed, appears uncomfortable CV:  Tachycardia with regular rhythm, normal peripheral perfusion Resp:  Lungs auscultation, mildly labored respirations in the setting of acute pain Abd:  Diffuse tenderness, not well localized, no rebound or guarding, not distended   ED Results / Procedures / Treatments   Labs (all labs ordered are listed, but only abnormal results are displayed) Labs Reviewed  BASIC METABOLIC PANEL - Abnormal; Notable for the following components:      Result Value   Potassium 3.4 (*)    CO2 21 (*)    Glucose, Bld 152 (*)    All other components within normal limits  CBC -  Abnormal; Notable for the following components:   RBC 5.32 (*)    MCV 74.8 (*)    MCH 22.7 (*)    RDW 19.6 (*)    All other components within normal limits  HEPATIC FUNCTION PANEL - Abnormal; Notable for the following components:   Total Protein 8.2 (*)    All other components within normal limits  LIPASE, BLOOD  POC URINE PREG, ED  TROPONIN I (HIGH SENSITIVITY)  TROPONIN I (HIGH SENSITIVITY)    RADIOLOGY: CT scan, independently reviewed and interpreted by myself without evidence of obstruction, does note some fluid filled bowel, agree with radiology interpretation. Chest x-Sage Hammill, and pendantly reviewed interpreted by myself without evidence of focal pneumonia.  Mild atelectasis noted by radiology, otherwise without significant acute abnormality.   PROCEDURES:  Critical Care performed: No  Procedures   MEDICATIONS ORDERED IN ED: Medications  morphine (PF) 4 MG/ML injection 4 mg (4 mg Intravenous Given 03/08/23 1235)  ondansetron (ZOFRAN) injection 4 mg (4 mg Intravenous Given 03/08/23 1235)  sodium chloride 0.9 % bolus 1,000 mL (1,000 mLs Intravenous New Bag/Given 03/08/23 1236)  iohexol (OMNIPAQUE)  300 MG/ML solution 100 mL (100 mLs Intravenous Contrast Given 03/08/23 1351)     IMPRESSION / MDM / ASSESSMENT AND PLAN / ED COURSE  I reviewed the triage vital signs and the nursing notes.  Differential diagnosis includes, but is not limited to colitis, appendicitis, pancreatitis, viral GI illness  Patient's presentation is most consistent with acute presentation with potential threat to life or bodily function.  37 year old female presenting with abdominal pain radiating to the chest.  Treated symptomatically with IV fluids, morphine,Zofran. Workup overall reassuring.  Lab work without significant derangement.  Imaging without significant acute finding.  On reevaluation, patient reports significant improvement in her symptoms.  She is updated on the results of her workup.  She is  comfortable discharge home.  Strict return precautions provided.  Will DC with prescription for Bentyl, Zofran for symptomatic relief.       FINAL CLINICAL IMPRESSION(S) / ED DIAGNOSES   Final diagnoses:  Acute abdominal pain     Rx / DC Orders   ED Discharge Orders          Ordered    ondansetron (ZOFRAN) 4 MG tablet  Every 6 hours PRN        03/08/23 1439    dicyclomine (BENTYL) 20 MG tablet  Every 6 hours PRN        03/08/23 1439             Note:  This document was prepared using Dragon voice recognition software and may include unintentional dictation errors.   Trinna Post, MD 03/08/23 [phone]

## 2023-03-08 NOTE — Discharge Instructions (Signed)
You were seen in the emergency room today for evaluation of your abdominal pain.  Your lab work, and CT scan were fortunately reassuring.  I suspect you likely have a viral GI illness.  You can take Tylenol as needed for pain.  You can also trial ibuprofen, but this can sometimes make abdominal pain worse.  I also sent a prescription for an anticramping medicine called Bentyl to your pharmacy and a nausea medicine called Zofran that you can take as needed.  Follow your primary care doctor within a few days if your symptoms not improved.  Return to the ER for new or worsening symptoms.

## 2023-03-08 NOTE — ED Triage Notes (Signed)
Patient to ED via POV for chest pain with abd pain N/V/D. States she has also been having SOB. States she was seen by PCP yesterday.

## 2024-10-11 ENCOUNTER — Encounter: Payer: Self-pay | Admitting: Emergency Medicine

## 2024-10-11 ENCOUNTER — Emergency Department

## 2024-10-11 ENCOUNTER — Other Ambulatory Visit: Payer: Self-pay

## 2024-10-11 ENCOUNTER — Emergency Department: Admission: EM | Admit: 2024-10-11 | Discharge: 2024-10-11 | Disposition: A | Discharge status: Home / Self Care

## 2024-10-11 DIAGNOSIS — M65332 Trigger finger, left middle finger: Secondary | ICD-10-CM | POA: Diagnosis not present

## 2024-10-11 DIAGNOSIS — M5412 Radiculopathy, cervical region: Secondary | ICD-10-CM | POA: Diagnosis not present

## 2024-10-11 DIAGNOSIS — R200 Anesthesia of skin: Secondary | ICD-10-CM | POA: Diagnosis present

## 2024-10-11 LAB — BASIC METABOLIC PANEL WITH GFR
Anion gap: 11 (ref 5–15)
BUN: 8 mg/dL (ref 6–20)
CO2: 23 mmol/L (ref 22–32)
Calcium: 9.3 mg/dL (ref 8.9–10.3)
Chloride: 104 mmol/L (ref 98–111)
Creatinine, Ser: 0.65 mg/dL (ref 0.44–1.00)
GFR, Estimated: >60 mL/min (ref >60)
Glucose, Bld: 80 mg/dL (ref 70–99)
Potassium: 4.1 mmol/L (ref 3.5–5.1)
Sodium: 137 mmol/L (ref 135–145)

## 2024-10-11 LAB — CBC
HCT: 36.8 % (ref 36.0–46.0)
Hemoglobin: 10.9 g/dL — ABNORMAL LOW (ref 12.0–15.0)
MCH: 20.8 pg — ABNORMAL LOW (ref 26.0–34.0)
MCHC: 29.6 g/dL — ABNORMAL LOW (ref 30.0–36.0)
MCV: 70.4 fL — ABNORMAL LOW (ref 80.0–100.0)
Platelets: 327 K/uL (ref 150–400)
RBC: 5.23 MIL/uL — ABNORMAL HIGH (ref 3.87–5.11)
RDW: 22.2 % — ABNORMAL HIGH (ref 11.5–15.5)
WBC: 5.8 K/uL (ref 4.0–10.5)
nRBC: 0 % (ref 0.0–0.2)

## 2024-10-11 LAB — TROPONIN T, HIGH SENSITIVITY: Troponin T High Sensitivity: <15 ng/L (ref 0–19)

## 2024-10-11 MED ORDER — CYCLOBENZAPRINE HCL 5 MG PO TABS: 5.0000 mg | ORAL_TABLET | Freq: Three times a day (TID) | ORAL | 0 refills | Status: AC | PRN

## 2024-10-11 NOTE — ED Provider Notes (Signed)
 [place] EMERGENCY DEPARTMENT AT [facility] Provider Note   CSN: [id number] Arrival date & time: 10/11/24  1241     Patient presents with: Numbness   Dorothy Chen is a 38 y.o. female presents to the emergency department for evaluation of numbness and tingling right hand down the left arm.  Patient states the numbness tingling starts at her neck, radiates into the shoulder down into the arm and into the all 5 digits of the left hand.  Symptoms began 4 weeks ago.  There is no trauma or injury.  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  2 weeks ago she was seen at the walk-in clinic, had a prednisone  taper for 6 days with only mild relief.  She is currently not taking any medications.  Has not been on any NSAIDs or muscle relaxers.  She does report some muscle spasms.  She also reports some tenderness on the left third digit A1 pulley with catching and occasional locking.  She denies any weakness in the upper extremities.  No chest pain or shortness of breath.  No fevers or chills      Prior to Admission medications  Medication Sig Start Date End Date Taking? Authorizing Provider  cyclobenzaprine (FLEXERIL) 5 MG tablet Take 1-2 tablets (5-10 mg total) by mouth 3 (three) times daily as needed. 10/11/24  Yes Charlene Debby BROCKS, PA-C  gabapentin  (NEURONTIN ) 300 MG capsule Take 1 capsule (300 mg total) by mouth at bedtime. 10/11/24  Yes Charlene Debby BROCKS, PA-C  meloxicam (MOBIC) 15 MG tablet Take 1 tablet (15 mg total) by mouth daily. 10/11/24 10/11/25 Yes Charlene Debby BROCKS, PA-C  albuterol (VENTOLIN HFA) 108 (90 Base) MCG/ACT inhaler Inhale 1-2 puffs into the lungs every 6 (six) hours as needed for wheezing or shortness of breath.     [provider]  amLODipine (NORVASC) 2.5 MG tablet Take 2.5 mg by mouth daily. 05/28/22   [provider]  COLACE 100 MG capsule Take 100 mg by mouth 2 (two) times daily as needed. 05/28/22   [provider]   dicyclomine  (BENTYL ) 20 MG tablet Take 1 tablet (20 mg total) by mouth every 6 (six) hours as needed for up to 7 days for spasms. 03/08/23 03/15/23  Levander Slate, MD  FLUoxetine (PROZAC) 20 MG capsule Take 20 mg by mouth daily. 05/28/22   [provider]  pantoprazole (PROTONIX) 40 MG tablet Take 40 mg by mouth daily. 05/28/22   [provider]  Simethicone (GAS-X PO) Take 1 tablet by mouth daily as needed (gas).    [provider]  tranexamic acid  (LYSTEDA ) 650 MG TABS tablet Take 2 tablets (1,300 mg total) by mouth 3 (three) times daily. For 5 days with menses 08/10/22   Copland, Alicia B, PA-C    Allergies: Amoxicillin and Penicillins    Review of Systems  Updated Vital Signs BP (!) 151/102 (BP Location: Right Arm)   Pulse 73   Temp 98.3 F (36.8 C) (Oral)   Resp 16   Ht 5' 1 (1.549 m)   Wt 104.3 kg   SpO2 100%   BMI 43.46 kg/m   Physical Exam Constitutional:      Appearance: She is well-developed.  HENT:     Head: Normocephalic and atraumatic.  Eyes:     Conjunctiva/sclera: Conjunctivae normal.  Cardiovascular:     Rate and Rhythm: Normal rate.  Pulmonary:     Effort: Pulmonary effort is normal. No respiratory distress.  Musculoskeletal:  General: Normal range of motion.     Cervical back: Normal range of motion.     Comments: Left hand: Left middle finger with tenderness on the A1 pulley with palpable catching but no locking.  No swelling warmth or redness.   Cervical Spine: Examination of the cervical spine reveals no bony abnormality, no edema, and no ecchymosis.  There is no step-off.  The patient has full active and passive range of motion of the cervical spine with flexion, extension, and right and left bend with rotation.  There is no crepitus with range of motion exercises.  The patient is non-tender along the spinous process to palpation.  The patient has no paravertebral muscle spasm.  There is no parascapular discomfort.  The  patient has a negative axial compression test.  The patient has a positive left Spurling test.  The patient has a negative overhead arm test for thoracic outlet syndrome.    Left upper Extremity: Examination of the left shoulder and arm showed no bony abnormality or edema.  The patient has normal active and passive motion with abduction, flexion, internal rotation, and external rotation.  The patient has no tenderness with motion.  The patient has a negative Hawkins test and a negative impingement test.  The patient has a negative drop arm test.  The patient is non-tender along the deltoid muscle.  There is no subacromial space tenderness with no AC joint tenderness.  The patient has no instability of the shoulder with anterior-posterior motion.  There is a negative sulcus sign.  The rotator cuff muscle strength is 5/5 with supraspinatus, 5/5 with internal rotation, and 5/5 with external rotation.  There is no crepitus with range of motion activities.  Negative Tinel's and Phalen's at the wrist.    Skin:    General: Skin is warm.     Findings: No rash.  Neurological:     General: No focal deficit present.     Mental Status: She is alert and oriented to person, place, and time. Mental status is at baseline.  Psychiatric:        Behavior: Behavior normal.        Thought Content: Thought content normal.     (all labs ordered are listed, but only abnormal results are displayed) Labs Reviewed  CBC - Abnormal; Notable for the following components:      Result Value   RBC 5.23 (*)    Hemoglobin 10.9 (*)    MCV 70.4 (*)    MCH 20.8 (*)    MCHC 29.6 (*)    RDW 22.2 (*)    All other components within normal limits  BASIC METABOLIC PANEL WITH GFR  POC URINE PREG, ED  TROPONIN T, HIGH SENSITIVITY    EKG: EKG Interpretation Date/Time:  Thursday October 11 2024 13:34:09 EST Ventricular Rate:  61 PR Interval:  144 QRS Duration:  80 QT Interval:  374 QTC Calculation: 376 R  Axis:   113  Text Interpretation: Normal sinus rhythm Right axis deviation Abnormal ECG When compared with ECG of 08-Mar-2023 10:12, Vent. rate has decreased BY  39 BPM Confirmed by UNCONFIRMED, DOCTOR ([id number]), editor Alex Slough [phone]) on 10/11/2024 2:25:11 PM  Radiology: DG Chest 2 View Result Date: 10/11/2024 CLINICAL DATA:  Chest pain and shortness of breath. EXAM: CHEST - 2 VIEW COMPARISON:  Chest radiograph dated 03/08/2023. FINDINGS: The heart size and mediastinal contours are within normal limits. Both lungs are clear. The visualized skeletal structures are unremarkable. IMPRESSION: No active cardiopulmonary  disease. Electronically Signed   By: Vanetta Chou M.D.   On: 10/11/2024 14:58     Procedures   Medications Ordered in the ED - No data to display                                  Medical Decision Making Amount and/or Complexity of Data Reviewed Labs: ordered. Radiology: ordered.  Risk Prescription drug management.   38 year old female with 4 weeks of left cervical radiculopathy symptoms.  She has had no reports or findings of physical exam weakness or neurological deficits.  She is given prednisone  taper a couple weeks ago with only mild relief.  Today, we will place her on meloxicam, Flexeril and gabapentin .  She had a negative workup today, normal chest x-ray with normal EKG and troponin.  Normal CBC and BMP.  She appears well with no neurological deficits.  She is encouraged to follow-up with PCP and/or orthopedics.  We also discussed physical exam findings and symptoms consistent with left middle trigger finger.  Trigger finger is mild.  She will follow-up with orthopedics.  She understands signs symptoms return to the ER for. Final diagnoses:  Left cervical radiculopathy  Trigger finger, left middle finger    ED Discharge Orders          Ordered    meloxicam (MOBIC) 15 MG tablet  Daily        10/11/24 1633    gabapentin  (NEURONTIN ) 300 MG capsule  Daily at  bedtime        10/11/24 1633    cyclobenzaprine (FLEXERIL) 5 MG tablet  3 times daily PRN        10/11/24 1633               Riven Beebe C, PA-C 10/11/24 1638    Clarine Ozell LABOR, MD 10/12/24 1106

## 2024-10-11 NOTE — ED Triage Notes (Signed)
 Pt to ED via POV c/o pain, numbness, and tingling in her left arm for the past few weeks. Pt states that she has also been having some intermittent chest pain or pulling in her chest. Pt states that she is supposed to be on blood pressure medication but she stopped taking it last year. Pt is in NAD.

## 2024-10-11 NOTE — ED Notes (Addendum)
 This RN went over d/c instructions and medications. No further questions or concerns expressed. Pt refused repeat vitals

## 2024-10-11 NOTE — Discharge Instructions (Signed)
 Please take medications as prescribed.  Call primary care provider or orthopedic office to schedule follow-up appointment.  Return to the ER if you develop any weakness, severe pain, fevers, worsening symptoms or any urgent changes in your health.
